# Patient Record
Sex: Male | Born: 1980
Health system: Southern US, Community
[De-identification: ages and names within clinical notes are randomized; demographics above are authoritative.]

## PROBLEM LIST (undated history)

## (undated) DIAGNOSIS — Z8679 Personal history of other diseases of the circulatory system: Secondary | ICD-10-CM

## (undated) DIAGNOSIS — F410 Panic disorder [episodic paroxysmal anxiety] without agoraphobia: Secondary | ICD-10-CM

## (undated) DIAGNOSIS — I10 Essential (primary) hypertension: Secondary | ICD-10-CM

## (undated) DIAGNOSIS — M25562 Pain in left knee: Secondary | ICD-10-CM

## (undated) DIAGNOSIS — G43909 Migraine, unspecified, not intractable, without status migrainosus: Secondary | ICD-10-CM

## (undated) DIAGNOSIS — Z8619 Personal history of other infectious and parasitic diseases: Secondary | ICD-10-CM

## (undated) DIAGNOSIS — M549 Dorsalgia, unspecified: Secondary | ICD-10-CM

## (undated) DIAGNOSIS — J302 Other seasonal allergic rhinitis: Secondary | ICD-10-CM

## (undated) DIAGNOSIS — T7840XA Allergy, unspecified, initial encounter: Secondary | ICD-10-CM

## (undated) DIAGNOSIS — E785 Hyperlipidemia, unspecified: Secondary | ICD-10-CM

## (undated) DIAGNOSIS — F1290 Cannabis use, unspecified, uncomplicated: Secondary | ICD-10-CM

## (undated) HISTORY — DX: Migraine, unspecified, not intractable, without status migrainosus: G43.909

## (undated) HISTORY — DX: Dorsalgia, unspecified: M54.9

## (undated) HISTORY — DX: Personal history of other diseases of the circulatory system: Z86.79

## (undated) HISTORY — DX: Panic disorder (episodic paroxysmal anxiety): F41.0

## (undated) HISTORY — DX: Personal history of other infectious and parasitic diseases: Z86.19

## (undated) HISTORY — DX: Cannabis use, unspecified, uncomplicated: F12.90

## (undated) HISTORY — DX: Essential (primary) hypertension: I10

## (undated) HISTORY — DX: Pain in left knee: M25.562

## (undated) HISTORY — DX: Hyperlipidemia, unspecified: E78.5

## (undated) HISTORY — DX: Other seasonal allergic rhinitis: J30.2

## (undated) HISTORY — DX: Allergy, unspecified, initial encounter: T78.40XA

---
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
## 2010-08-30 ENCOUNTER — Emergency Department (HOSPITAL_COMMUNITY): Payer: BC Managed Care – PPO

## 2010-08-30 ENCOUNTER — Inpatient Hospital Stay (HOSPITAL_COMMUNITY)
Admission: EM | Admit: 2010-08-30 | Discharge: 2010-09-01 | DRG: 144 | Disposition: A | Discharge status: Home / Self Care | Payer: BC Managed Care – PPO | Attending: Internal Medicine | Admitting: Internal Medicine

## 2010-08-30 ENCOUNTER — Encounter (HOSPITAL_COMMUNITY): Payer: Self-pay | Admitting: Radiology

## 2010-08-30 DIAGNOSIS — Z7982 Long term (current) use of aspirin: Secondary | ICD-10-CM

## 2010-08-30 DIAGNOSIS — J9819 Other pulmonary collapse: Secondary | ICD-10-CM | POA: Diagnosis present

## 2010-08-30 DIAGNOSIS — F410 Panic disorder [episodic paroxysmal anxiety] without agoraphobia: Secondary | ICD-10-CM | POA: Diagnosis present

## 2010-08-30 DIAGNOSIS — I309 Acute pericarditis, unspecified: Principal | ICD-10-CM | POA: Diagnosis present

## 2010-08-30 LAB — CBC
HCT: 44.9 % (ref 39.0–52.0)
Hemoglobin: 16.6 g/dL (ref 13.0–17.0)
MCHC: 37 g/dL — ABNORMAL HIGH (ref 30.0–36.0)
MCV: 85.7 fL (ref 78.0–100.0)
RDW: 12.8 % (ref 11.5–15.5)
WBC: 15.1 10*3/uL — ABNORMAL HIGH (ref 4.0–10.5)

## 2010-08-30 LAB — CK TOTAL AND CKMB (NOT AT ARMC)
CK, MB: 1.4 ng/mL (ref 0.3–4.0)
Relative Index: INVALID (ref 0.0–2.5)
Relative Index: INVALID (ref 0.0–2.5)
Total CK: 89 U/L (ref 7–232)

## 2010-08-30 LAB — TROPONIN I
Troponin I: <0.3 ng/mL (ref <0.30)
Troponin I: <0.3 ng/mL (ref <0.30)

## 2010-08-30 LAB — DIFFERENTIAL
Eosinophils Relative: 0 % (ref 0–5)
Lymphocytes Relative: 8 % — ABNORMAL LOW (ref 12–46)
Lymphs Abs: 1.3 10*3/uL (ref 0.7–4.0)
Monocytes Absolute: 1.2 10*3/uL — ABNORMAL HIGH (ref 0.1–1.0)
Neutro Abs: 12.6 10*3/uL — ABNORMAL HIGH (ref 1.7–7.7)

## 2010-08-30 LAB — COMPREHENSIVE METABOLIC PANEL
ALT: 29 U/L (ref 0–53)
AST: 19 U/L (ref 0–37)
Albumin: 4.4 g/dL (ref 3.5–5.2)
CO2: 27 mEq/L (ref 19–32)
Calcium: 10.1 mg/dL (ref 8.4–10.5)
GFR calc non Af Amer: >60 mL/min (ref >60)
Sodium: 139 mEq/L (ref 135–145)

## 2010-08-30 MED ORDER — IOHEXOL 300 MG/ML  SOLN
100.0000 mL | Freq: Once | INTRAMUSCULAR | Status: AC | PRN
  Administered 2010-08-30: 100 mL via INTRAVENOUS

## 2010-08-31 LAB — CARDIAC PANEL(CRET KIN+CKTOT+MB+TROPI)
CK, MB: 1.6 ng/mL (ref 0.3–4.0)
CK, MB: 1.5 ng/mL (ref 0.3–4.0)
Relative Index: INVALID (ref 0.0–2.5)
Total CK: 64 U/L (ref 7–232)
Total CK: 61 U/L (ref 7–232)
Troponin I: <0.3 ng/mL (ref <0.30)
Troponin I: <0.3 ng/mL (ref <0.30)

## 2010-08-31 LAB — CBC
HCT: 44.5 % (ref 39.0–52.0)
Hemoglobin: 16 g/dL (ref 13.0–17.0)
RBC: 5.13 MIL/uL (ref 4.22–5.81)
WBC: 8.5 10*3/uL (ref 4.0–10.5)

## 2010-08-31 LAB — BASIC METABOLIC PANEL
Calcium: 9.8 mg/dL (ref 8.4–10.5)
GFR calc Af Amer: >60 mL/min (ref >60)
GFR calc non Af Amer: >60 mL/min (ref >60)
Glucose, Bld: 90 mg/dL (ref 70–99)
Potassium: 3.6 mEq/L (ref 3.5–5.1)
Sodium: 140 mEq/L (ref 135–145)

## 2010-08-31 LAB — PHOSPHORUS: Phosphorus: 1.2 mg/dL — ABNORMAL LOW (ref 2.3–4.6)

## 2010-08-31 LAB — RAPID URINE DRUG SCREEN, HOSP PERFORMED
Barbiturates: NOT DETECTED
Cocaine: NOT DETECTED
Tetrahydrocannabinol: POSITIVE — AB

## 2010-08-31 LAB — SEDIMENTATION RATE: Sed Rate: 2 mm/hr (ref 0–16)

## 2010-09-01 ENCOUNTER — Inpatient Hospital Stay (HOSPITAL_COMMUNITY): Payer: BC Managed Care – PPO

## 2010-09-01 ENCOUNTER — Encounter (HOSPITAL_COMMUNITY): Payer: Self-pay | Admitting: Radiology

## 2010-09-01 LAB — BASIC METABOLIC PANEL
CO2: 26 mEq/L (ref 19–32)
Calcium: 9.6 mg/dL (ref 8.4–10.5)
Creatinine, Ser: 0.65 mg/dL (ref 0.50–1.35)
GFR calc non Af Amer: >60 mL/min (ref >60)
Glucose, Bld: 108 mg/dL — ABNORMAL HIGH (ref 70–99)
Sodium: 141 mEq/L (ref 135–145)

## 2010-09-01 LAB — CBC
MCH: 31.2 pg (ref 26.0–34.0)
MCHC: 36 g/dL (ref 30.0–36.0)
MCV: 86.5 fL (ref 78.0–100.0)
Platelets: 213 10*3/uL (ref 150–400)
RBC: 5.04 MIL/uL (ref 4.22–5.81)

## 2010-09-01 MED ORDER — IOHEXOL 300 MG/ML  SOLN
100.0000 mL | Freq: Once | INTRAMUSCULAR | Status: AC | PRN
  Administered 2010-09-01: 100 mL via INTRAVENOUS

## 2010-09-05 NOTE — Discharge Summary (Signed)
  NAMELYNDON, CHAPEL            ACCOUNT NO.:  [id number]  MEDICAL RECORD NO.:  [id number]  LOCATION:  2040                         FACILITY:  [facility]  PHYSICIAN:  Conley Canal, MD      DATE OF BIRTH:  [birth year]  DATE OF ADMISSION:  08/30/2010 DATE OF DISCHARGE:  09/01/2010                        DISCHARGE SUMMARY - REFERRING   PRIMARY CARE PHYSICIAN:  The patient has no primary care provider.  DISCHARGE DIAGNOSES: 1. Chest pain most likely secondary to acute early pericarditis versus     left-sided atelectasis. 2. Panic anxiety disorder.  DISCHARGE MEDICATIONS: 1. Alprazolam 0.5 mg twice daily as needed, 15 tablets given. 2. Motrin 400 mg 3 times daily. 3. Excedrin Headache 2 tablets daily as needed. 4. Tramadol 50 mg q.6h. as needed.  No new prescription given.  PROCEDURES PERFORMED: 1. CT chest with contrast on August 30, 2010, showed no evidence of PE.     Some mild dependent bilateral lower lobe atelectasis. 2. CT abdomen and pelvis on September 01, 2010, showed no CT findings to     account for the patient's abdominal symptoms. 3. A 2D echocardiogram on August 31, 2010, showed EF 55% to 60% as well     as a trivial pericardial effusion.  No tamponade physiology.  HOSPITAL COURSE:  Mr. Sem was admitted on August 30, 2010, with complaints of acute onset chest pain associated with an episode of vomiting.  There was concern for pericarditis at admission and echocardiogram showed trivial pericardial effusion.  Workup included a sed rate which was normal.  TSH was also normal.  Urine drug screen positive for marijuana.  Otherwise, cardiac enzymes were negative x3. CT chest suggested some bibasilar atelectasis, otherwise no PE or pneumonia.  His white count at admission was 15,000.  Given this concern, CT abdomen and pelvis was obtained as the patient had had an episode of vomiting which could be accounted for and the CT abdomen and pelvis was unremarkable.  It is likely  that he had early onset pericarditis which seems to have improved with some atelectasis.  He will need followup with his primary care physician if symptoms recur.  Labs include amylase, lipase electrolytes, and CBC which were all normal at discharge.  The time spent for this discharge preparation is less than 30 minutes.     Conley Canal, MD     SR/MEDQ  D:  09/01/2010  T:  09/01/2010  Job:  [id number]  Electronically Signed by Conley Canal  on 09/05/2010 05:46:26 PM

## 2010-09-13 ENCOUNTER — Other Ambulatory Visit: Payer: Self-pay | Admitting: *Deleted

## 2010-09-13 MED ORDER — ALPRAZOLAM 0.5 MG PO TABS: 0.5000 mg | ORAL_TABLET | Freq: Two times a day (BID) | ORAL | Status: DC | PRN

## 2010-09-13 NOTE — Telephone Encounter (Signed)
Rx called in as directed and patient notified.  

## 2010-09-13 NOTE — Telephone Encounter (Signed)
May refill #10, no refills.  plz phone in.

## 2010-09-13 NOTE — Telephone Encounter (Signed)
Patient called and requested a refill on his xanax. He is scheduled to come in and establish with you on 09-17-10. He was discharged from [facility] about 2 weeks ago with enough pills to last with the impression he would be able to establish with you last week. However, no appointments were available/convenient until this week. He said he was normally taking 1 QD but the Rx was for 1 BID. I told him if it was refilled it would only be enough for this week, but that I would have to call him to let him know for sure. He verbalized understanding.
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
## 2010-09-17 ENCOUNTER — Ambulatory Visit (INDEPENDENT_AMBULATORY_CARE_PROVIDER_SITE_OTHER): Payer: BC Managed Care – PPO | Admitting: Family Medicine

## 2010-09-17 ENCOUNTER — Encounter: Payer: Self-pay | Admitting: Family Medicine

## 2010-09-17 DIAGNOSIS — J302 Other seasonal allergic rhinitis: Secondary | ICD-10-CM | POA: Insufficient documentation

## 2010-09-17 DIAGNOSIS — F410 Panic disorder [episodic paroxysmal anxiety] without agoraphobia: Secondary | ICD-10-CM | POA: Insufficient documentation

## 2010-09-17 DIAGNOSIS — J309 Allergic rhinitis, unspecified: Secondary | ICD-10-CM

## 2010-09-17 DIAGNOSIS — G43909 Migraine, unspecified, not intractable, without status migrainosus: Secondary | ICD-10-CM

## 2010-09-17 DIAGNOSIS — Z0000 Encounter for general adult medical examination without abnormal findings: Secondary | ICD-10-CM

## 2010-09-17 DIAGNOSIS — F1290 Cannabis use, unspecified, uncomplicated: Secondary | ICD-10-CM

## 2010-09-17 DIAGNOSIS — M25562 Pain in left knee: Secondary | ICD-10-CM | POA: Insufficient documentation

## 2010-09-17 DIAGNOSIS — M549 Dorsalgia, unspecified: Secondary | ICD-10-CM | POA: Insufficient documentation

## 2010-09-17 DIAGNOSIS — M25569 Pain in unspecified knee: Secondary | ICD-10-CM

## 2010-09-17 DIAGNOSIS — Z8249 Family history of ischemic heart disease and other diseases of the circulatory system: Secondary | ICD-10-CM

## 2010-09-17 DIAGNOSIS — F1210 Cannabis abuse, uncomplicated: Secondary | ICD-10-CM

## 2010-09-17 MED ORDER — ALPRAZOLAM 0.5 MG PO TABS: 0.5000 mg | ORAL_TABLET | Freq: Every day | ORAL | Status: DC | PRN

## 2010-09-17 MED ORDER — CYCLOBENZAPRINE HCL 10 MG PO TABS: 10.0000 mg | ORAL_TABLET | Freq: Two times a day (BID) | ORAL | Status: DC | PRN

## 2010-09-17 MED ORDER — NAPROXEN 500 MG PO TABS: ORAL_TABLET | ORAL | Status: DC

## 2010-09-17 NOTE — Patient Instructions (Addendum)
Return fasting at your convenience for blood work to check cholesterol levels. Low cholesterol diet provided. Stop tramadol for now.   Start naprosyn twice daily with food as well as flexeril for back pain as needed (may make you sleepy). Bring name of knee doctor or records to see what has been done in past. Good to meet you today, call us with questions. knee exercises provided today.
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
## 2010-09-18 DIAGNOSIS — Z0001 Encounter for general adult medical examination with abnormal findings: Secondary | ICD-10-CM | POA: Insufficient documentation

## 2010-09-18 DIAGNOSIS — Z0000 Encounter for general adult medical examination without abnormal findings: Secondary | ICD-10-CM | POA: Insufficient documentation

## 2010-09-18 DIAGNOSIS — F1290 Cannabis use, unspecified, uncomplicated: Secondary | ICD-10-CM | POA: Insufficient documentation

## 2010-09-18 DIAGNOSIS — Z8249 Family history of ischemic heart disease and other diseases of the circulatory system: Secondary | ICD-10-CM | POA: Insufficient documentation

## 2010-09-18 HISTORY — DX: Cannabis use, unspecified, uncomplicated: F12.90

## 2010-09-18 NOTE — Assessment & Plan Note (Signed)
Recently improved.

## 2010-09-18 NOTE — Assessment & Plan Note (Signed)
Anticipate component of PFPS vs arthritis. Advised stop tramadol for now. Trial of NSAIDs, knee exercises from [facility] pt advisor for PFPS provided Await records from ortho, asked pt to bring.

## 2010-09-18 NOTE — Assessment & Plan Note (Signed)
Discussed anxiety/panic attacks Xanax for temporary use.   Provided with refill #20, rtc 1 mo.  If need for med continued, discussed better long term management strategy (SSRI, etc).  Pt agrees.

## 2010-09-18 NOTE — Assessment & Plan Note (Signed)
Reviewed recent blood work (from hospital). Return fasting for FLP to risk stratify.  Early fmhx CAD.

## 2010-09-21 ENCOUNTER — Telehealth: Payer: Self-pay | Admitting: *Deleted

## 2010-09-21 NOTE — Telephone Encounter (Signed)
Pt called to let you know that he had seen Dr. Lenard Forth for knee pain and the medicine that he gave him was relafen.  This made him sleepy.  The flexeril that you prescribed also makes him sleepy.  He feels sleepy and relaxed with it but it doesn't help the pain.  States he will continue to do what you have told him to do.

## 2010-09-22 NOTE — Telephone Encounter (Signed)
Please add name of Dr Lenard Forth (or possibly PA) to ROI and check if office is with [facility] in [place]. Will need him to return to sign ROI so we can send it. Is naprosyn scheduled helping?

## 2010-09-22 NOTE — Telephone Encounter (Signed)
Spoke with patient. He said he has no chest pain anymore. His back is hurting in the mornings when he wakes up to the point it's hard to get out of bed. He said it doesn't feel muscular as much as spinal. He said the naprosyn helps some. He likes the fact that he doesn't have to take as many pills throughout the day. He said he decreased his xanax down to 1/2 of a pill. He said he doesn't even need it everyday. He is going to stop by today and sign the ROI form.

## 2010-09-22 NOTE — Telephone Encounter (Signed)
Message left for patient to stop by and sign ROI form and to call and advise if naprosyn was helping.

## 2010-09-22 NOTE — Telephone Encounter (Signed)
Patient notified and ROI form signed.

## 2010-09-22 NOTE — Telephone Encounter (Signed)
Noted.  Give naprosyn more time, but if back pain getting worse may want him to come in prior to next scheduled appt.

## 2010-09-28 ENCOUNTER — Telehealth: Payer: Self-pay | Admitting: *Deleted

## 2010-09-28 NOTE — Telephone Encounter (Signed)
Ok to stop flexeril.  May take on PRN basis for back spasm/strain.  If so sedating, could try 1/2 pill when takes on PRN basis.

## 2010-09-28 NOTE — Telephone Encounter (Signed)
Message left advising patient. Instructed to call with any questions.  

## 2010-09-28 NOTE — Telephone Encounter (Signed)
Patient wants to know if he can stop taking Flexeril?  He stated that he has been taking it for about two weeks now and it's really not helping.  He stated that it makes him very sleepy and he sleeps for at least 12 hours after he has taken Flexeril and he stated that he cannot function and continue taking this medication.  Please advise.

## 2010-10-18 ENCOUNTER — Ambulatory Visit (INDEPENDENT_AMBULATORY_CARE_PROVIDER_SITE_OTHER): Payer: BC Managed Care – PPO | Admitting: Family Medicine

## 2010-10-18 ENCOUNTER — Encounter: Payer: Self-pay | Admitting: Family Medicine

## 2010-10-18 DIAGNOSIS — M25562 Pain in left knee: Secondary | ICD-10-CM

## 2010-10-18 DIAGNOSIS — Z0000 Encounter for general adult medical examination without abnormal findings: Secondary | ICD-10-CM

## 2010-10-18 DIAGNOSIS — Z13220 Encounter for screening for lipoid disorders: Secondary | ICD-10-CM

## 2010-10-18 DIAGNOSIS — M25569 Pain in unspecified knee: Secondary | ICD-10-CM

## 2010-10-18 DIAGNOSIS — Z23 Encounter for immunization: Secondary | ICD-10-CM

## 2010-10-18 DIAGNOSIS — Z8249 Family history of ischemic heart disease and other diseases of the circulatory system: Secondary | ICD-10-CM

## 2010-10-18 DIAGNOSIS — F410 Panic disorder [episodic paroxysmal anxiety] without agoraphobia: Secondary | ICD-10-CM

## 2010-10-18 LAB — LDL CHOLESTEROL, DIRECT: Direct LDL: 116.7 mg/dL

## 2010-10-18 LAB — LIPID PANEL
Cholesterol: 180 mg/dL (ref 0–200)
Total CHOL/HDL Ratio: 4

## 2010-10-18 NOTE — Progress Notes (Signed)
  Subjective:    Patient ID: Derrick Santiago, male    DOB: September 19, 1980, 30 y.o.   MRN: [id number]  HPI CC: 1 mo f/u  Panic attacks - resolved.  Still has xanax, hasn't needed to use in several weeks.  Knee and back pain - weaned off tramadol, none for last 3 wks.  Knee pain improved.  Overall feeling better off narcotics.  Not limping as much as previously.  Back pain continues, taking 1/2 flexeril as needed, using at night time only prn basis because causes significant sedation.Eilleen Kempf that when cracks back, pain improves.  Has signed release form for ortho.  Saw Dr. Lenard Forth at [facility].  Awaiting records.  Did have episode of dark red blood in stool, none when wiping.  This happened after 2 wks on naprosyn, has stopped NSAID.  No abd pain or nausea/vomiting with this, no fevers/chills, no dyspepsia.  Wt Readings from Last 3 Encounters:  10/18/10 224 lb 0.6 oz (101.624 kg)  09/17/10 221 lb 8 oz (100.472 kg)   Would like tetanus and flu shots today.  Due for blood work today.  Review of Systems Per HPI    Objective:   Physical Exam  Nursing note and vitals reviewed. Constitutional: He appears well-developed and well-nourished. No distress.  HENT:  Head: Normocephalic and atraumatic.  Mouth/Throat: Oropharynx is clear and moist. No oropharyngeal exudate.  Neck: Normal range of motion. Neck supple.  Cardiovascular: Normal rate, regular rhythm, normal heart sounds and intact distal pulses.   No murmur heard. Pulmonary/Chest: Effort normal and breath sounds normal. No respiratory distress. He has no wheezes. He has no rales.  Musculoskeletal: He exhibits no edema.  Skin: Skin is warm and dry. No rash noted.          Assessment & Plan:

## 2010-10-18 NOTE — Patient Instructions (Signed)
Flu and tetanus/pertussis (tdap) today. Blood work today. We will await records from ortho. Please return if things not improving as expected.

## 2010-10-18 NOTE — Assessment & Plan Note (Signed)
Improved

## 2010-10-18 NOTE — Assessment & Plan Note (Signed)
Improved.  Await records.

## 2010-12-07 ENCOUNTER — Other Ambulatory Visit: Payer: Self-pay | Admitting: Family Medicine

## 2011-05-10 ENCOUNTER — Telehealth: Payer: Self-pay

## 2011-05-10 NOTE — Telephone Encounter (Signed)
Pt called and did not get lab results from 10/2010. I gave results and pt said had lost blue card for phone tree. Pt said since 02/01/11 pt has kept migraine journal. Pt has had 9 migraines since Jan 2013 and takes OTC excedrin migraine. Pt said duration of migraine is from 4 hours to 10 + hours.Pt also has neck and back pain. Pts mother and brother have degenerative bone disease. Pt thinks he should be tested. Pt last seen by Dr Reece Agar 10/18/10. Pt said if Dr Reece Agar wanted to see him he would make appt. But wanted to ask Dr Reece Agar before making appt. Pt uses [facility] and can be reached at [phone].

## 2011-05-10 NOTE — Telephone Encounter (Signed)
plz have pt make appt to discuss.

## 2011-05-11 NOTE — Telephone Encounter (Signed)
Spoke with patient and scheduled appt

## 2011-05-18 ENCOUNTER — Ambulatory Visit (INDEPENDENT_AMBULATORY_CARE_PROVIDER_SITE_OTHER): Payer: BC Managed Care – PPO | Admitting: Family Medicine

## 2011-05-18 ENCOUNTER — Encounter: Payer: Self-pay | Admitting: Family Medicine

## 2011-05-18 VITALS — BP 144/82 | HR 84 | Temp 98.5°F | Wt 241.8 lb

## 2011-05-18 DIAGNOSIS — M549 Dorsalgia, unspecified: Secondary | ICD-10-CM

## 2011-05-18 DIAGNOSIS — G43909 Migraine, unspecified, not intractable, without status migrainosus: Secondary | ICD-10-CM

## 2011-05-18 MED ORDER — SUMATRIPTAN SUCCINATE 50 MG PO TABS: 50.0000 mg | ORAL_TABLET | Freq: Every day | ORAL | Status: DC | PRN

## 2011-05-18 MED ORDER — CYCLOBENZAPRINE HCL 5 MG PO TABS: 5.0000 mg | ORAL_TABLET | Freq: Two times a day (BID) | ORAL | Status: DC | PRN

## 2011-05-18 NOTE — Assessment & Plan Note (Signed)
Longstanding. Provided with LBP stretching exercises from [facility] pt advisor. If not improved, consider further eval (imaging).

## 2011-05-18 NOTE — Progress Notes (Signed)
  Subjective:    Patient ID: Derrick Santiago, male    DOB: 28-Jan-1981, 31 y.o.   MRN: [id number]  HPI CC: discuss migraines  Has been keeping headache diary, brings this today.  Usually controlled with 2 excedrin migraine, has tried boss' imitrex.  Usually hot shower also relieves some.  Has had 9 migraines since January.  Had 4 migraines between September and January.  Usually excedrin improves pain but doesn't completely resolve pain (when helps, migraine only lasts 4 hours).  If he doesn't catch migraine early enough, HA may last 8-10 hours.  Described as throbbing pain bilateral vertex of head at parietal region.  Feels them coming on with neck stiffness then radiation from neck up posterior head.  + photo/phonophobia.  No nausea.  1d prior to migraine gets loose stool as well.  Occasional aura - sees different colors in vision.  Sometimes debilitating headache but if caught early enough able to control enough to continue at work.  No fevers/chills.  No vision changes.  No dizziness. No double vision.  LBP and neck pain - Alternates tylenol, ibuprofen, and aleve for longstanding back and neck pain.  No recent imaging.  Neck pain - stays at base of neck.  Feels stiffness intermittently.  No numbness or paresthesias.  No shooting pain down arms.  No arm weakness.  Lower back pain - occasional shooting pain down left lateral thigh to knee.  Denies numbness or weakness in legs.  Used to do roofing work.  Back/neck pain did improve with less physical work.  But notes more on weekends when working around house.  Family history of migraines and DDD.  Past Medical History  Diagnosis Date  . Seasonal allergies   . History of high blood pressure     readings- no dx of HTN  . Migraines   . Panic attack     worse last 6 mo  . History of chicken pox   . Back pain   . Left knee pain    Past Surgical History  Procedure Date  . Appendectomy 1993    Review of Systems Per HPI    Objective:     Physical Exam  Nursing note and vitals reviewed. Constitutional: He is oriented to person, place, and time. He appears well-developed and well-nourished. No distress.  HENT:  Head: Normocephalic and atraumatic.  Mouth/Throat: Oropharynx is clear and moist. No oropharyngeal exudate.  Eyes: Conjunctivae and EOM are normal. Pupils are equal, round, and reactive to light. No scleral icterus.  Neck: Normal range of motion. Neck supple.  Musculoskeletal:       FROM at neck. Splenius mm tightness bilaterally, mild  No midline lumbar spine tenderness. No paraspinous mm tenderness but significant tightness lumbar mm bilaterally. Neg SLR bilaterally, no pain with int/ext rotation at hip. Mild tenderness at GTB bilaterally, no pain at sciatic notch or SIJ bilaterally  Neurological: He is alert and oriented to person, place, and time. He has normal strength. No cranial nerve deficit or sensory deficit. He displays a negative Romberg sign. Coordination normal.       CN 2-12 intact FTN intact. No pronator drift  Skin: Skin is warm and dry. No rash noted.  Psychiatric: He has a normal mood and affect.       Assessment & Plan:

## 2011-05-18 NOTE — Assessment & Plan Note (Addendum)
Discussed MOH. Discussed treatment of migraines and importance of catching early. Treat with abortive regimen of imitrex and flexeril.  Discussed use of these meds. Continue to monitor for migraine triggers. Sent in 5mg  flexeril as when cuts 10mg  causes increased sedation. A total of 25 minutes were spent face-to-face with the patient during this encounter and over half of that time was spent on counseling and coordination of care

## 2011-05-18 NOTE — Patient Instructions (Signed)
For lower back - try stretching exercises provided. For migraines - trial of flexeril and imitrex at onset of headache. Let me know how this works. Good to see you today, call us with quesitons.

## 2012-01-13 ENCOUNTER — Encounter: Payer: Self-pay | Admitting: Family Medicine

## 2012-01-13 ENCOUNTER — Ambulatory Visit (INDEPENDENT_AMBULATORY_CARE_PROVIDER_SITE_OTHER): Payer: BC Managed Care – PPO | Admitting: Family Medicine

## 2012-01-13 VITALS — BP 138/86 | HR 96 | Temp 98.4°F | Wt 244.8 lb

## 2012-01-13 DIAGNOSIS — L509 Urticaria, unspecified: Secondary | ICD-10-CM

## 2012-01-13 DIAGNOSIS — Z23 Encounter for immunization: Secondary | ICD-10-CM

## 2012-01-13 NOTE — Assessment & Plan Note (Signed)
Only skin involvement currently. Unclear trigger. Discussed generalized urticaria, ?stress induced.. Treat with claritin qd - bid, benadryl prn. To update [place] if sxs persist or not improving for referral to allergist for further evaluation.

## 2012-01-13 NOTE — Patient Instructions (Addendum)
You do have hives. Treat with claritin daily (may go up to 10mg  twice daily if needed) and benadryl for breakthrough itching.  Could also add zantac or pepcid. If not improving with this, let me know for allergist referral Flu shot today.  Hives Hives are itchy, red, swollen areas of the skin. They can vary in size and location on your body. Hives can come and go for hours or several days (acute hives) or for several weeks (chronic hives). Hives do not spread from person to person (noncontagious). They may get worse with scratching, exercise, and emotional stress. CAUSES   Allergic reaction to food, additives, or drugs.  Infections, including the common cold.  Illness, such as vasculitis, lupus, or thyroid disease.  Exposure to sunlight, heat, or cold.  Exercise.  Stress.  Contact with chemicals. SYMPTOMS   Red or white swollen patches on the skin. The patches may change size, shape, and location quickly and repeatedly.  Itching.  Swelling of the hands, feet, and face. This may occur if hives develop deeper in the skin. DIAGNOSIS  Your caregiver can usually tell what is wrong by performing a physical exam. Skin or blood tests may also be done to determine the cause of your hives. In some cases, the cause cannot be determined. TREATMENT  Mild cases usually get better with medicines such as antihistamines. Severe cases may require an emergency epinephrine injection. If the cause of your hives is known, treatment includes avoiding that trigger.  HOME CARE INSTRUCTIONS   Avoid causes that trigger your hives.  Take antihistamines as directed by your caregiver to reduce the severity of your hives. Non-sedating or low-sedating antihistamines are usually recommended. Do not drive while taking an antihistamine.  Take any other medicines prescribed for itching as directed by your caregiver.  Wear loose-fitting clothing.  Keep all follow-up appointments as directed by your  caregiver. SEEK MEDICAL CARE IF:   You have persistent or severe itching that is not relieved with medicine.  You have painful or swollen joints. SEEK IMMEDIATE MEDICAL CARE IF:   You have a fever.  Your tongue or lips are swollen.  You have trouble breathing or swallowing.  You feel tightness in the throat or chest.  You have abdominal pain. These problems may be the first sign of a life-threatening allergic reaction. Call your local emergency services (911 in [place] MAKE SURE YOU:   Understand these instructions.  Will watch your condition.  Will get help right away if you are not doing well or get worse. Document Released: 01/17/2005 Document Revised: 07/19/2011 Document Reviewed: 04/12/2011 [employer] Patient Information 2013 [place], [place].

## 2012-01-13 NOTE — Addendum Note (Signed)
Addended by: Annamarie Major on: 01/13/2012 03:29 PM   Modules accepted: Orders

## 2012-01-13 NOTE — Progress Notes (Signed)
  Subjective:    Patient ID: Derrick Santiago, male    DOB: 09-Mar-1980, 30 y.o.   MRN: [id number]  HPI CC: hives  1 wk prior to thanksgiving traveled on airplane - when left plane, noticed rash on neck and upper chest.  This improved.  Then over last few weeks, had subway meatball sandwich on flatbread twice - had "hive on lip" with some swelling and hives over face.  Yesterday bad hives over neck and face, as well as chest, hip, arm.  Tends to have more at work.Took claritin last night at 8pm.  Hasn't noticed specific trigger.  Brings picture of hives on neck, underarm, arms, chest.  Was taking claritin daily, stopped taking November. Recently promoted to management Warehouse manager.  Increased stress here. Checks bp at store and ranges 120-130/70s.  did not feel more warm than normal.  No new lotions, detergents, soaps, shampoos.  No diet changes.  No new foods.    Would like flu shot today.  Has been taking 1 aleve regularly since spring 2013 - for knee and back pain.  No recent imitrex use.  Has tried benadryl as well  Helps itch but doesn't help hives resolve.  claritin does help hives resolve.  Wt Readings from Last 3 Encounters:  01/13/12 244 lb 12 oz (111.018 kg)  05/18/11 241 lb 12 oz (109.657 kg)  10/18/10 224 lb 0.6 oz (101.624 kg)    Past Medical History  Diagnosis Date  . Seasonal allergies   . History of high blood pressure     readings- no dx of HTN  . Migraines   . Panic attack     worse mid 2012  . History of chicken pox   . Back pain   . Left knee pain     Review of Systems Per HPI    Objective:   Physical Exam NAD, CM, obese, WDWN No dermatographia.  No skin rashes.    Assessment & Plan:

## 2012-02-29 ENCOUNTER — Other Ambulatory Visit: Payer: Self-pay | Admitting: Family Medicine

## 2012-02-29 DIAGNOSIS — E781 Pure hyperglyceridemia: Secondary | ICD-10-CM

## 2012-02-29 DIAGNOSIS — Z0000 Encounter for general adult medical examination without abnormal findings: Secondary | ICD-10-CM

## 2012-03-08 ENCOUNTER — Other Ambulatory Visit (INDEPENDENT_AMBULATORY_CARE_PROVIDER_SITE_OTHER): Payer: BC Managed Care – PPO

## 2012-03-08 DIAGNOSIS — E781 Pure hyperglyceridemia: Secondary | ICD-10-CM

## 2012-03-08 LAB — COMPREHENSIVE METABOLIC PANEL
BUN: 15 mg/dL (ref 6–23)
CO2: 27 mEq/L (ref 19–32)
Calcium: 9.3 mg/dL (ref 8.4–10.5)
Chloride: 104 mEq/L (ref 96–112)
Creatinine, Ser: 0.9 mg/dL (ref 0.4–1.5)
GFR: 97.63 mL/min (ref >60.00)
Glucose, Bld: 101 mg/dL — ABNORMAL HIGH (ref 70–99)
Total Bilirubin: 0.8 mg/dL (ref 0.3–1.2)

## 2012-03-08 LAB — TSH: TSH: 1 u[IU]/mL (ref 0.35–5.50)

## 2012-03-08 LAB — LIPID PANEL
Cholesterol: 189 mg/dL (ref 0–200)
HDL: 38.4 mg/dL — ABNORMAL LOW (ref >39.00)
Triglycerides: 123 mg/dL (ref 0.0–149.0)

## 2012-03-14 ENCOUNTER — Encounter: Payer: BC Managed Care – PPO | Admitting: Family Medicine

## 2012-03-15 ENCOUNTER — Encounter: Payer: BC Managed Care – PPO | Admitting: Family Medicine

## 2012-03-23 ENCOUNTER — Ambulatory Visit (INDEPENDENT_AMBULATORY_CARE_PROVIDER_SITE_OTHER): Payer: BC Managed Care – PPO | Admitting: Family Medicine

## 2012-03-23 ENCOUNTER — Encounter: Payer: Self-pay | Admitting: Family Medicine

## 2012-03-23 VITALS — BP 142/82 | HR 96 | Temp 98.2°F | Ht 74.0 in | Wt 246.0 lb

## 2012-03-23 DIAGNOSIS — G43909 Migraine, unspecified, not intractable, without status migrainosus: Secondary | ICD-10-CM

## 2012-03-23 DIAGNOSIS — R7401 Elevation of levels of liver transaminase levels: Secondary | ICD-10-CM

## 2012-03-23 DIAGNOSIS — Z0000 Encounter for general adult medical examination without abnormal findings: Secondary | ICD-10-CM

## 2012-03-23 DIAGNOSIS — L509 Urticaria, unspecified: Secondary | ICD-10-CM

## 2012-03-23 DIAGNOSIS — R0789 Other chest pain: Secondary | ICD-10-CM

## 2012-03-23 MED ORDER — HYDROXYZINE HCL 25 MG PO TABS: 25.0000 mg | ORAL_TABLET | Freq: Three times a day (TID) | ORAL | Status: DC | PRN

## 2012-03-23 NOTE — Assessment & Plan Note (Signed)
Preventative protocols reviewed and updated unless pt declined. Discussed healthy diet and lifestyle.  

## 2012-03-23 NOTE — Assessment & Plan Note (Signed)
In h/o panic attacks but with fmhx early CAD. Declines stress test referral. Will do trial of hydroxyzine for likely anxiety attacks - if not helping, low threshold to refer to cardiology. Aware to notify me immediately or seek urgent care if worsening chest pain.

## 2012-03-23 NOTE — Patient Instructions (Signed)
For chest pain - try hydroxyzine as needed for anxiety.  If not helping, call me for referral to heart doctor. For neck - try regular flexeril twice to three times daily as well as ice/heat to neck.  We will recheck at next visit. Return in 3months for follow up Return prior to appointment for blood work to recheck liver function Good to see you today ,call us with questions.

## 2012-03-23 NOTE — Assessment & Plan Note (Signed)
Declines toradol shot today. Will go home, take imitrex, hot shower and nap.

## 2012-03-23 NOTE — Assessment & Plan Note (Signed)
Thought to be due to soy bean oil.

## 2012-03-23 NOTE — Progress Notes (Signed)
Subjective:    Patient ID: Derrick Santiago, male    DOB: [birth year], 32 y.o.   MRN: [id number]  HPI CC: CPE  Currently with migraine.  Started last night, improved with imitrex.  Over the day today has migraine has returned.  Describes bilateral frontal throbbing pain.  No current photo/phonophobia, nausea.  Felt nauseated this morning.  + aura.  Urticaria - stopped soy bean oil and hives went away.  Chronic neck pain - longstanding.  Intermittent L chest discomfort weekly described as tightness with shortness of breath.  Lasts 10 min to 2 hours.  Occurs more with stress.  Not exertional - walks daily 2-5 mi at work and home, never any chest discomfort with this. Hospitalization 09/2010 with chest discomfort, with CT chest /abd /pelvis and 2D echo showing trivial pericardial effusion with EF 55-60%. Mother with h/o CAD/MI at age 28s.  She was a smoker and didn't eat healthy.  Pt eats much more healthy.  bp elevated today - attributes to migraine today. BP Readings from Last 3 Encounters:  03/23/12 142/82  01/13/12 138/86  05/18/11 144/82   Wt Readings from Last 3 Encounters:  03/23/12 246 lb (111.585 kg)  01/13/12 244 lb 12 oz (111.018 kg)  05/18/11 241 lb 12 oz (109.657 kg)  Body mass index is 31.57 kg/(m^2).   Transaminitis - very mild on blood work.  Occasional EtOH.  Rare tylenol.  Recently stopped aleve 2/2 GI upset.  Seat belt use discussed. Sunscreen use discussed.  Preventative:  CPE last 2007 with blood work. Did have several labs done in hospital, all except FLP.  Tetanus: 10/2010 Flu - 01/2012   Medications and allergies reviewed and updated in chart.  Past histories reviewed and updated if relevant as below. Patient Active Problem List  Diagnosis  . Panic attack  . Back pain  . Left knee pain  . Seasonal allergies  . Migraines  . Healthcare maintenance  . Family history of early CAD  . Marijuana smoker  . Urticaria   Past Medical History  Diagnosis  Date  . Seasonal allergies   . History of high blood pressure     readings- no dx of HTN  . Migraines   . Panic attack     worse mid 2012  . History of chicken pox   . Back pain   . Left knee pain    Past Surgical History  Procedure Laterality Date  . Appendectomy  1993   History  Substance Use Topics  . Smoking status: Never Smoker   . Smokeless tobacco: Never Used  . Alcohol Use: Yes     Comment: Occasionally-rare   Family History  Problem Relation Age of Onset  . Coronary artery disease Mother 67    CAD/MI  . Stroke Mother   . Hypertension Mother   . Osteoarthritis Mother   . Arthritis Maternal Grandmother   . Cancer Maternal Grandfather 40    prostate  . Kidney disease Maternal Grandfather   . Diabetes Neg Hx   . Cancer Other     great grandmother with lung and breast cnacer   No Known Allergies Current Outpatient Prescriptions on File Prior to Visit  Medication Sig Dispense Refill  . aspirin-acetaminophen-caffeine (EXCEDRIN MIGRAINE) 250-250-65 MG per tablet Take 2 tablets by mouth as needed.        . cyclobenzaprine (FLEXERIL) 5 MG tablet Take 1 tablet (5 mg total) by mouth 2 (two) times daily as needed for muscle spasms (migraines).  30 tablet  3  . loratadine (CLARITIN) 10 MG tablet Take 10 mg by mouth daily.      . SUMAtriptan (IMITREX) 50 MG tablet Take 1 tablet (50 mg total) by mouth daily as needed for migraine. May rpt 2nd dose in 2 hours if migraine not relieved  10 tablet  3   No current facility-administered medications on file prior to visit.    Review of Systems  Constitutional: Negative for fever, chills, activity change, appetite change, fatigue and unexpected weight change.  HENT: Negative for hearing loss and neck pain.   Eyes: Negative for visual disturbance.  Respiratory: Negative for cough, chest tightness, shortness of breath and wheezing.   Cardiovascular: Positive for chest pain (see HPI). Negative for palpitations and leg swelling.   Gastrointestinal: Negative for nausea, vomiting, abdominal pain, diarrhea, constipation, blood in stool and abdominal distention.  Genitourinary: Negative for hematuria and difficulty urinating.  Musculoskeletal: Negative for myalgias and arthralgias.  Skin: Negative for rash.  Neurological: Positive for headaches (migraines). Negative for dizziness, seizures and syncope.  Hematological: Does not bruise/bleed easily.  Psychiatric/Behavioral: Negative for dysphoric mood. The patient is nervous/anxious.        Objective:   Physical Exam  Nursing note and vitals reviewed. Constitutional: He is oriented to person, place, and time. He appears well-developed and well-nourished. No distress.  HENT:  Head: Normocephalic and atraumatic.  Right Ear: Hearing, tympanic membrane, external ear and ear canal normal.  Left Ear: Hearing, tympanic membrane, external ear and ear canal normal.  Nose: Nose normal.  Mouth/Throat: Oropharynx is clear and moist. No oropharyngeal exudate.  Eyes: Conjunctivae and EOM are normal. Pupils are equal, round, and reactive to light. No scleral icterus.  Neck: Normal range of motion. Neck supple. Carotid bruit is not present. No thyromegaly present.  Cardiovascular: Normal rate, regular rhythm, normal heart sounds and intact distal pulses.   No murmur heard. Pulses:      Radial pulses are 2+ on the right side, and 2+ on the left side.  Pulmonary/Chest: Effort normal and breath sounds normal. No respiratory distress. He has no wheezes. He has no rales.  Abdominal: Soft. Bowel sounds are normal. He exhibits no distension and no mass. There is no tenderness. There is no rebound and no guarding.  Musculoskeletal: Normal range of motion. He exhibits no edema.  No midline spine tenderness. + paraspinous cervical muscle tightness  Lymphadenopathy:    He has no cervical adenopathy.  Neurological: He is alert and oriented to person, place, and time.  CN grossly intact,  station and gait intact  Skin: Skin is warm and dry. No rash noted.  Psychiatric: He has a normal mood and affect. His behavior is normal. Judgment and thought content normal.      Assessment & Plan:

## 2012-06-13 ENCOUNTER — Ambulatory Visit: Payer: Self-pay | Admitting: Family Medicine

## 2012-06-15 ENCOUNTER — Other Ambulatory Visit (INDEPENDENT_AMBULATORY_CARE_PROVIDER_SITE_OTHER): Payer: BC Managed Care – PPO

## 2012-06-15 DIAGNOSIS — R7401 Elevation of levels of liver transaminase levels: Secondary | ICD-10-CM

## 2012-06-15 LAB — HEPATIC FUNCTION PANEL
Alkaline Phosphatase: 69 U/L (ref 39–117)
Bilirubin, Direct: 0 mg/dL (ref 0.0–0.3)

## 2012-06-22 ENCOUNTER — Encounter: Payer: Self-pay | Admitting: Family Medicine

## 2012-06-22 ENCOUNTER — Ambulatory Visit (INDEPENDENT_AMBULATORY_CARE_PROVIDER_SITE_OTHER): Payer: BC Managed Care – PPO | Admitting: Family Medicine

## 2012-06-22 VITALS — BP 142/86 | HR 60 | Temp 98.7°F | Wt 241.8 lb

## 2012-06-22 DIAGNOSIS — I10 Essential (primary) hypertension: Secondary | ICD-10-CM | POA: Insufficient documentation

## 2012-06-22 DIAGNOSIS — F410 Panic disorder [episodic paroxysmal anxiety] without agoraphobia: Secondary | ICD-10-CM

## 2012-06-22 DIAGNOSIS — R030 Elevated blood-pressure reading, without diagnosis of hypertension: Secondary | ICD-10-CM

## 2012-06-22 DIAGNOSIS — R7401 Elevation of levels of liver transaminase levels: Secondary | ICD-10-CM

## 2012-06-22 DIAGNOSIS — M2550 Pain in unspecified joint: Secondary | ICD-10-CM

## 2012-06-22 MED ORDER — HYDROXYZINE HCL 25 MG PO TABS: 25.0000 mg | ORAL_TABLET | Freq: Two times a day (BID) | ORAL | Status: DC | PRN

## 2012-06-22 NOTE — Patient Instructions (Signed)
Let's keep an eye on blood pressure as you've been doing.  Let us know if persistently elevated to discuss blood pressure medicine. In interim, work on weight - more aerobic exercise. Continue healthy diet. Good to see you today, call us with questions. Return as needed or in 9 months for physical.

## 2012-06-22 NOTE — Assessment & Plan Note (Signed)
Improved - recheck next blood draw, consider abd [place], iron panel and acute hepatitis panel.

## 2012-06-22 NOTE — Assessment & Plan Note (Signed)
Stable on hydroxyzine prn. Continue.

## 2012-06-22 NOTE — Assessment & Plan Note (Signed)
Does not sound like inflammatory arthritis - will continue to monitor.

## 2012-06-22 NOTE — Progress Notes (Signed)
  Subjective:    Patient ID: Derrick Santiago, male    DOB: 01-08-1981, 32 y.o.   MRN: [id number]  HPI CC:34mo f/u  Elevated BP today - bp at pharmacy has been 130s/80s.  Father with h/o HTN at age late 57s.  No vision changes, CP/tightness, SOB, leg swelling.   Wt Readings from Last 3 Encounters:  06/22/12 241 lb 12 oz (109.657 kg)  03/23/12 246 lb (111.585 kg)  01/13/12 244 lb 12 oz (111.018 kg)  trying to lose weight - more active, cutting back on red meat, avoiding fried foods.  Active with son playing baseball.  Drinks coffee, water, sweet tea.  Rare soda.  Fruits/vegetables daily.   Transaminitis - very mild on blood work. Occasional EtOH. Rare tylenol. Recently stopped aleve 2/2 GI upset.  Migraines - 1-2/month.  Has noted decreased frequency of regular headaches.  Anxiety - using hydroxyzine prn, every few days as needed.  Sometimes makes him sleepy.  Polyarthralgia - noted after strenuous exercise.  Neck, back, shoulders, elbows, hands, knees, feet.  On flexeril 5mg  as needed.  1 hour of morning stiffness that resolves with hot shower. No swelling of joints or redness or warmth.  Soy allergy.  Past Medical History  Diagnosis Date  . Seasonal allergies   . History of high blood pressure     readings- no dx of HTN  . Migraines   . Panic attack     worse mid 2012  . History of chicken pox   . Back pain   . Left knee pain   . History of hepatitis A 1993     Review of Systems Per HPI    Objective:   Physical Exam  Nursing note and vitals reviewed. Constitutional: He appears well-developed and well-nourished. No distress.  HENT:  Head: Normocephalic and atraumatic.  Mouth/Throat: Oropharynx is clear and moist. No oropharyngeal exudate.  Eyes: Conjunctivae and EOM are normal. Pupils are equal, round, and reactive to light. No scleral icterus.  Neck: Normal range of motion. Neck supple.  Cardiovascular: Normal rate, regular rhythm, normal heart sounds and intact  distal pulses.   No murmur heard. Pulmonary/Chest: Effort normal and breath sounds normal. No respiratory distress. He has no wheezes. He has no rales.  Skin: Skin is warm and dry. No rash noted.  Psychiatric: He has a normal mood and affect.       Assessment & Plan:

## 2012-06-22 NOTE — Assessment & Plan Note (Signed)
On recheck better BP.  Will not start med today - but advised him to continue to monitor at home and notify me if elevated. Discussed importance of weight loss, and low sodium diet to help keep BP under control.

## 2012-12-06 ENCOUNTER — Other Ambulatory Visit: Payer: Self-pay

## 2012-12-15 IMAGING — CT CT ANGIO CHEST
2 of 4 series · 19 of 46 positions shown · IV contrast (agent unspecified)
Comparison: None.

CLINICAL DATA: Chest pain and shortness of breath.

CT ANGIOGRAPHY CHEST WITH CONTRAST
TECHNIQUE: Multidetector CT imaging of the chest was performed
using the standard protocol during bolus administration of
intravenous contrast.  Multiplanar CT image reconstructions
including MIPs were obtained to evaluate the vascular anatomy.
Contrast:  100 ml 7mnipaque-DRR

[Series 9: pulm embolism 1.0 b25f thin · axial · 0.70mm/px · z∈[+1266,+1539]mm · 16 of 301 slices shown]
[im 14/301  lung]
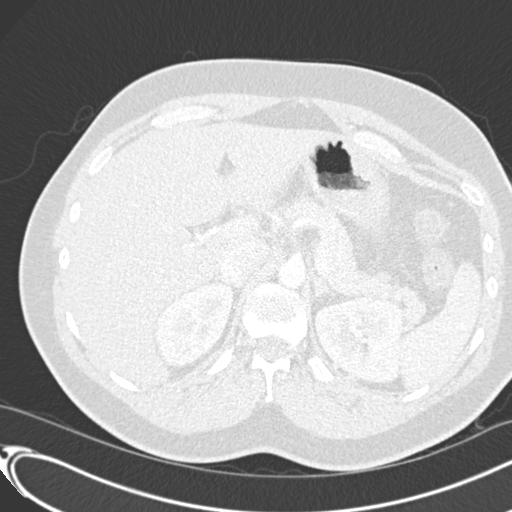
[im 40/301  soft-tissue]
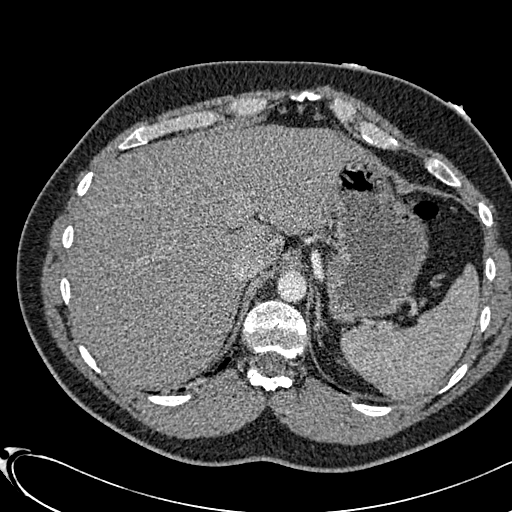
[im 53/301  lung]
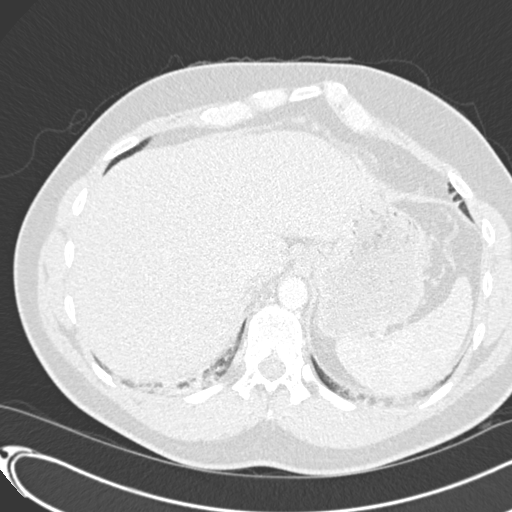
[im 66/301  soft-tissue]
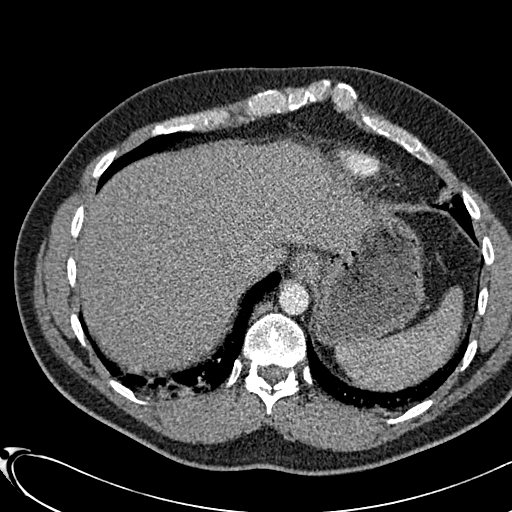
[im 92/301  lung]
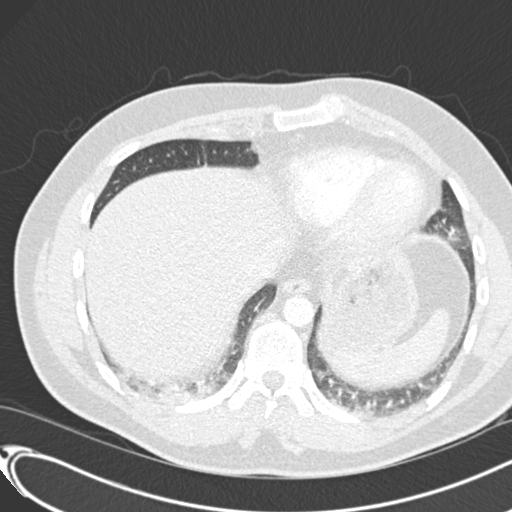
[im 105/301  soft-tissue]
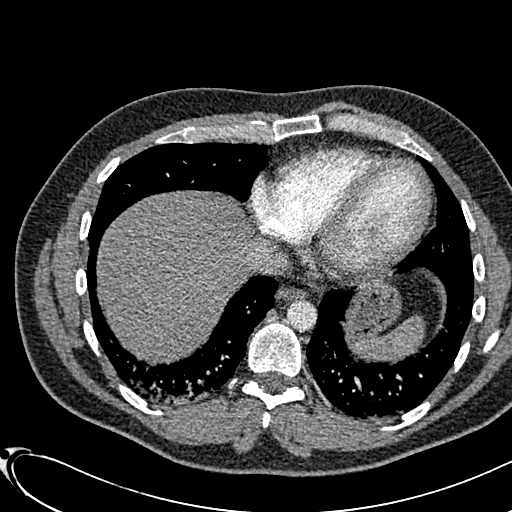
[im 118/301  lung]
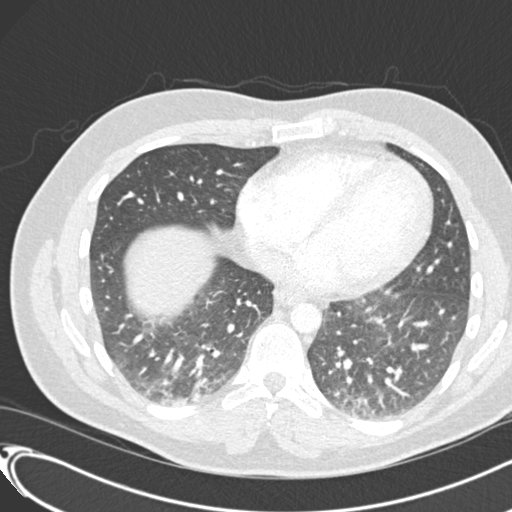
[im 144/301  soft-tissue]
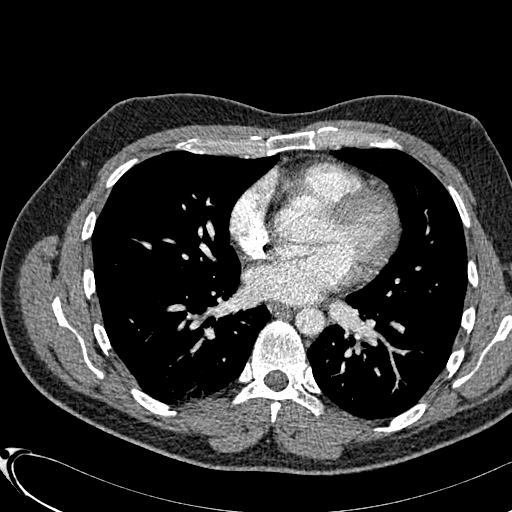
[im 157/301  lung]
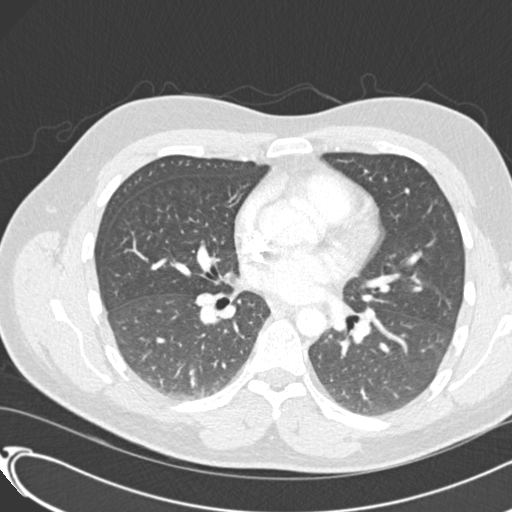
[im 183/301  soft-tissue]
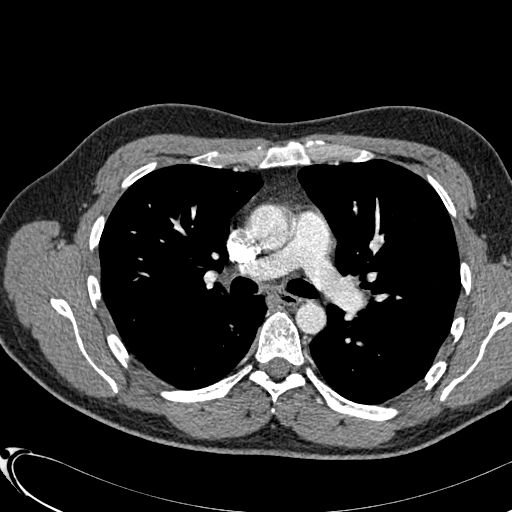
[im 196/301  lung]
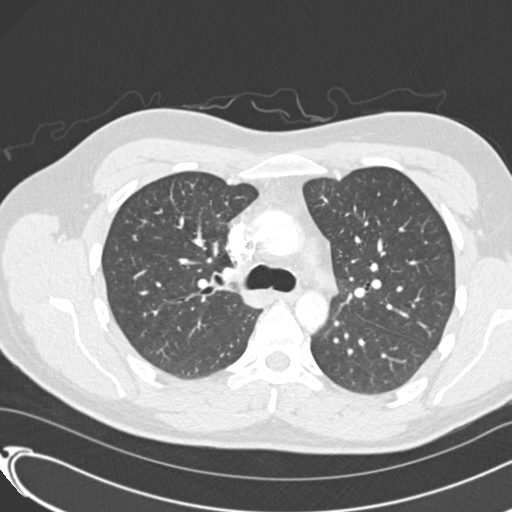
[im 209/301  soft-tissue]
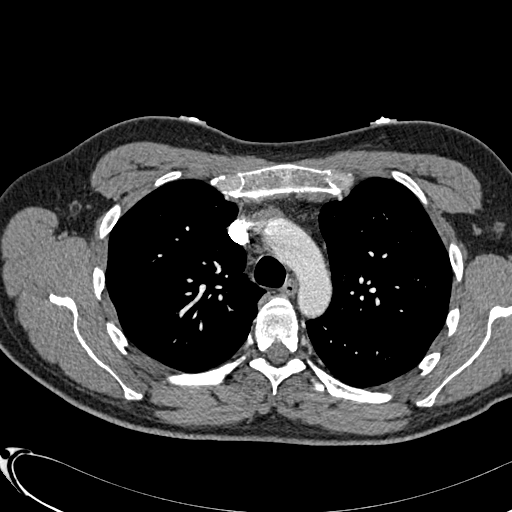
[im 235/301  lung]
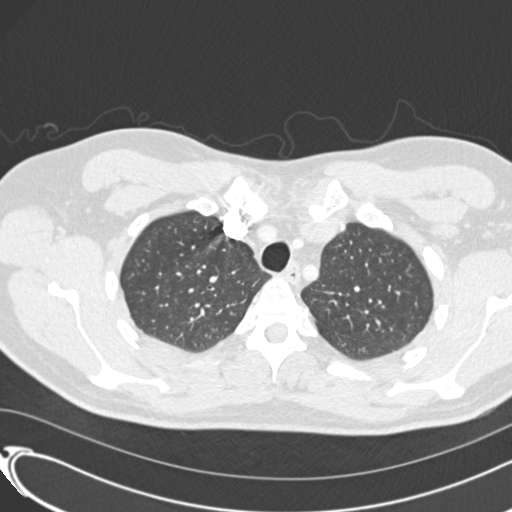
[im 248/301  soft-tissue]
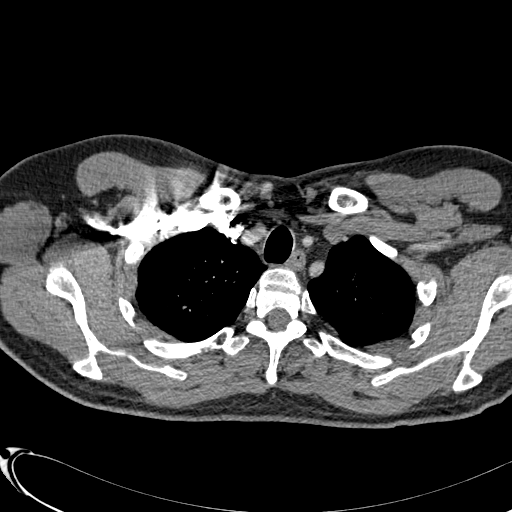
[im 261/301  lung]
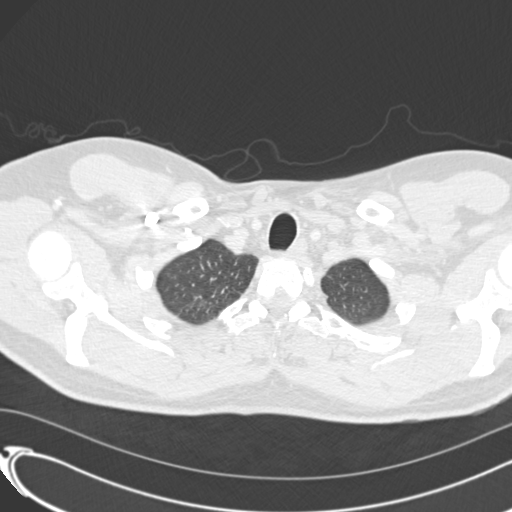
[im 287/301  soft-tissue]
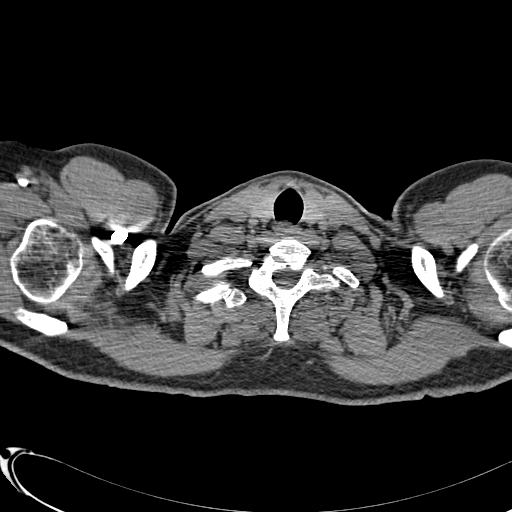

[Series 602: cor · coronal · 0.70mm/px · 3 of 136 slices shown]
[im 46/136  soft-tissue]
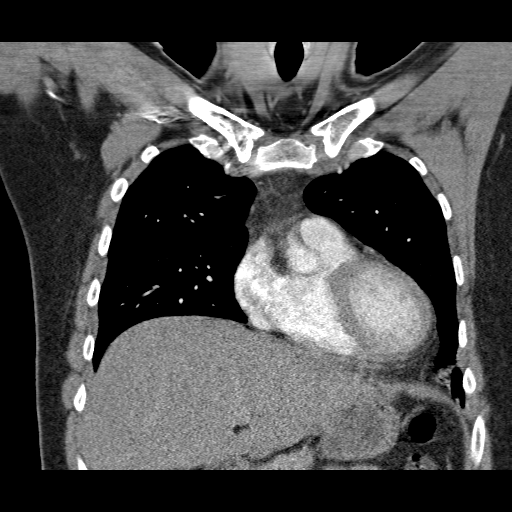
[im 61/136  soft-tissue]
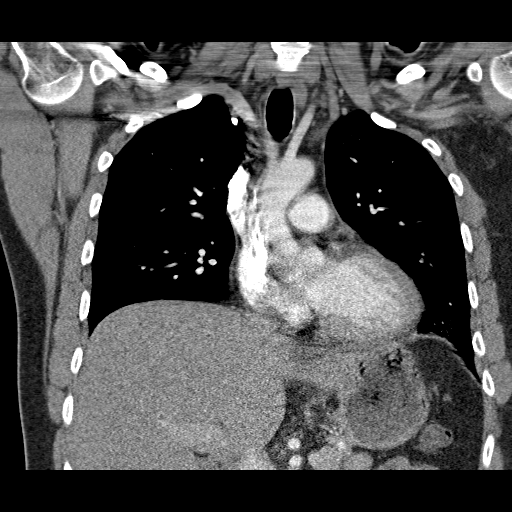
[im 76/136  soft-tissue]
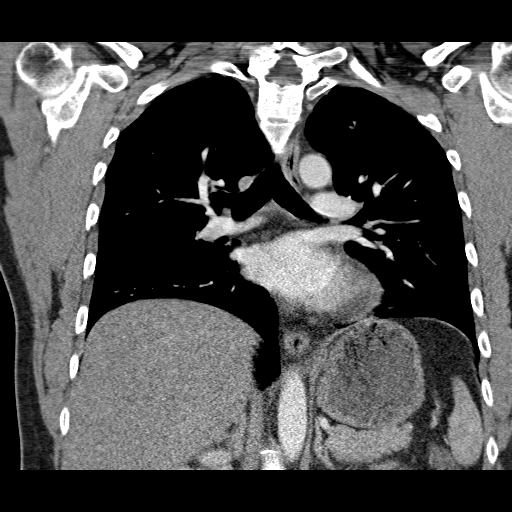

[19 of 46 positions shown; findings below may reference images not displayed]

FINDINGS: Satisfactory opacification of the pulmonary arteries
noted, and there is no evidence of pulmonary emboli.  No evidence
of thoracic aortic aneurysm or dissection.  No evidence of
mediastinal or hilar soft tissue masses.  No lymphadenopathy seen
elsewhere within the thorax.

No evidence of pleural or pericardial effusion.  Mild dependent
atelectasis seen in both lower lobes, but there is no evidence of
pulmonary consolidation or mass.  No evidence of endobronchial
lesion. A small calcified granuloma seen in the right lung apex.

Review of the MIP images confirms the above findings.
IMPRESSION: 1.  No evidence of pulmonary embolism.
2.  Mild dependent bilateral lower lobe atelectasis.

## 2013-03-17 ENCOUNTER — Other Ambulatory Visit: Payer: Self-pay | Admitting: Family Medicine

## 2013-03-17 DIAGNOSIS — R740 Nonspecific elevation of levels of transaminase and lactic acid dehydrogenase [LDH]: Secondary | ICD-10-CM

## 2013-03-17 DIAGNOSIS — R7401 Elevation of levels of liver transaminase levels: Secondary | ICD-10-CM

## 2013-03-17 DIAGNOSIS — Z8249 Family history of ischemic heart disease and other diseases of the circulatory system: Secondary | ICD-10-CM

## 2013-03-18 ENCOUNTER — Other Ambulatory Visit (INDEPENDENT_AMBULATORY_CARE_PROVIDER_SITE_OTHER): Payer: BC Managed Care – PPO

## 2013-03-18 DIAGNOSIS — Z0000 Encounter for general adult medical examination without abnormal findings: Secondary | ICD-10-CM

## 2013-03-18 DIAGNOSIS — R7402 Elevation of levels of lactic acid dehydrogenase (LDH): Secondary | ICD-10-CM

## 2013-03-18 DIAGNOSIS — Z8249 Family history of ischemic heart disease and other diseases of the circulatory system: Secondary | ICD-10-CM

## 2013-03-18 DIAGNOSIS — R030 Elevated blood-pressure reading, without diagnosis of hypertension: Secondary | ICD-10-CM

## 2013-03-18 DIAGNOSIS — R740 Nonspecific elevation of levels of transaminase and lactic acid dehydrogenase [LDH]: Secondary | ICD-10-CM

## 2013-03-18 DIAGNOSIS — R7401 Elevation of levels of liver transaminase levels: Secondary | ICD-10-CM

## 2013-03-18 LAB — LIPID PANEL
CHOLESTEROL: 195 mg/dL (ref 0–200)
HDL: 42.1 mg/dL (ref >39.00)
LDL CALC: 116 mg/dL — AB (ref 0–99)
Total CHOL/HDL Ratio: 5
Triglycerides: 184 mg/dL — ABNORMAL HIGH (ref 0.0–149.0)
VLDL: 36.8 mg/dL (ref 0.0–40.0)

## 2013-03-18 LAB — COMPREHENSIVE METABOLIC PANEL
ALBUMIN: 4.2 g/dL (ref 3.5–5.2)
ALT: 52 U/L (ref 0–53)
AST: 22 U/L (ref 0–37)
Alkaline Phosphatase: 67 U/L (ref 39–117)
BUN: 12 mg/dL (ref 6–23)
CALCIUM: 9.3 mg/dL (ref 8.4–10.5)
CHLORIDE: 105 meq/L (ref 96–112)
CO2: 27 mEq/L (ref 19–32)
Creatinine, Ser: 0.9 mg/dL (ref 0.4–1.5)
GFR: 108.99 mL/min (ref >60.00)
Glucose, Bld: 110 mg/dL — ABNORMAL HIGH (ref 70–99)
POTASSIUM: 3.8 meq/L (ref 3.5–5.1)
Sodium: 139 mEq/L (ref 135–145)
Total Bilirubin: 0.8 mg/dL (ref 0.3–1.2)
Total Protein: 7.2 g/dL (ref 6.0–8.3)

## 2013-03-18 LAB — IBC PANEL
Iron: 131 ug/dL (ref 42–165)
Saturation Ratios: 41.9 % (ref 20.0–50.0)
TRANSFERRIN: 223.5 mg/dL (ref 212.0–360.0)

## 2013-03-19 LAB — HEPATITIS PANEL, ACUTE
HCV Ab: NEGATIVE
HEP A IGM: NONREACTIVE
HEP B C IGM: NONREACTIVE
Hepatitis B Surface Ag: NEGATIVE

## 2013-03-25 ENCOUNTER — Ambulatory Visit (INDEPENDENT_AMBULATORY_CARE_PROVIDER_SITE_OTHER): Payer: BC Managed Care – PPO | Admitting: Family Medicine

## 2013-03-25 ENCOUNTER — Encounter: Payer: Self-pay | Admitting: Family Medicine

## 2013-03-25 VITALS — BP 140/80 | HR 80 | Temp 98.4°F | Ht 74.0 in | Wt 244.8 lb

## 2013-03-25 DIAGNOSIS — Z0000 Encounter for general adult medical examination without abnormal findings: Secondary | ICD-10-CM

## 2013-03-25 DIAGNOSIS — M542 Cervicalgia: Secondary | ICD-10-CM | POA: Insufficient documentation

## 2013-03-25 DIAGNOSIS — R7402 Elevation of levels of lactic acid dehydrogenase (LDH): Secondary | ICD-10-CM

## 2013-03-25 DIAGNOSIS — G43909 Migraine, unspecified, not intractable, without status migrainosus: Secondary | ICD-10-CM

## 2013-03-25 DIAGNOSIS — R7401 Elevation of levels of liver transaminase levels: Secondary | ICD-10-CM

## 2013-03-25 DIAGNOSIS — R740 Nonspecific elevation of levels of transaminase and lactic acid dehydrogenase [LDH]: Secondary | ICD-10-CM

## 2013-03-25 DIAGNOSIS — F410 Panic disorder [episodic paroxysmal anxiety] without agoraphobia: Secondary | ICD-10-CM

## 2013-03-25 NOTE — Assessment & Plan Note (Signed)
Ongoing.  No red flags - no radiculopathy, no paresthesias or weakness.  FROM at neck.  [facility]

## 2013-03-25 NOTE — Assessment & Plan Note (Signed)
Preventative protocols reviewed and updated unless pt declined. Discussed healthy diet and lifestyle.  

## 2013-03-25 NOTE — Patient Instructions (Addendum)
Good to see you today, call us with questions. Watch triglyceride levels and sugar control - watch added sugars. Continue regular exercise. Return in 1 year for next physical.

## 2013-03-25 NOTE — Assessment & Plan Note (Signed)
Stable with rare prn hydroxyzine use.

## 2013-03-25 NOTE — Assessment & Plan Note (Signed)
Flexeril too sedating. Stable on excedrin and imitrex prn for abortive therapy 1-2 migraines per month.

## 2013-03-25 NOTE — Progress Notes (Signed)
Pre visit review using our clinic review tool, if applicable. No additional management support is needed unless otherwise documented below in the visit note. 

## 2013-03-25 NOTE — Assessment & Plan Note (Signed)
This has resolved. Will continue to monitor.  If again elevated, would consider abd US.

## 2013-03-25 NOTE — Progress Notes (Signed)
BP 140/80  Pulse 80  Temp(Src) 98.4 F (36.9 C) (Oral)  Ht 6\' 2"  (1.88 m)  Wt 244 lb 12 oz (111.018 kg)  BMI 31.41 kg/m2   CC: annual exam  Subjective:    Patient ID: Derrick Santiago, male    DOB: 1980-10-18, 33 y.o.   MRN: [id number]  HPI: Derrick SaxRickey L Fish is a 33 y.o. male presenting on 03/25/2013 with Annual Exam   Pt has increased exercise - strength training and cardio.   Anxiety - prn hydroxyzine, minimal use.  Has noticed improvement of anxiety with increased exercise. Migraine - 1-2 per month. Intermittent neck pain - ongoing issue.  Mid neck.  Flexeril causes sedation.  Takes aleve 1 per day.  Seat belt use discussed.  Sunscreen use discussed. No sunburns in last year.  Preventative:  Tetanus: 10/2010  Flu - declines  Wt Readings from Last 3 Encounters:  03/25/13 244 lb 12 oz (111.018 kg)  06/22/12 241 lb 12 oz (109.657 kg)  03/23/12 246 lb (111.585 kg)  Body mass index is 31.41 kg/(m^2).   Relevant past medical, surgical, family and social history reviewed and updated as indicated.  Allergies and medications reviewed and updated. Current Outpatient Prescriptions on File Prior to Visit  Medication Sig  . aspirin-acetaminophen-caffeine (EXCEDRIN MIGRAINE) 250-250-65 MG per tablet Take 2 tablets by mouth as needed.    . hydrOXYzine (ATARAX/VISTARIL) 25 MG tablet Take 1 tablet (25 mg total) by mouth 2 (two) times daily as needed for anxiety (sedation precautions).  . loratadine (CLARITIN) 10 MG tablet Take 10 mg by mouth daily.  . Multiple Vitamin (MULTIVITAMIN) tablet Take 1 tablet by mouth daily.  . naproxen sodium (ANAPROX) 220 MG tablet Take 220 mg by mouth daily.  . SUMAtriptan (IMITREX) 50 MG tablet Take 1 tablet (50 mg total) by mouth daily as needed for migraine. May rpt 2nd dose in 2 hours if migraine not relieved   No current facility-administered medications on file prior to visit.    Review of Systems  Constitutional: Negative for fever,  chills, activity change, appetite change, fatigue and unexpected weight change.  HENT: Negative for hearing loss.   Eyes: Negative for visual disturbance.  Respiratory: Negative for cough, chest tightness, shortness of breath and wheezing.   Cardiovascular: Positive for chest pain (occasional discomfort - due to anxiety). Negative for palpitations and leg swelling.  Gastrointestinal: Negative for nausea, vomiting, abdominal pain, diarrhea, constipation, blood in stool and abdominal distention.  Genitourinary: Negative for hematuria and difficulty urinating.  Musculoskeletal: Positive for neck pain (occasional). Negative for arthralgias and myalgias.  Skin: Negative for rash.  Neurological: Positive for headaches. Negative for dizziness, seizures and syncope.  Hematological: Negative for adenopathy. Does not bruise/bleed easily.  Psychiatric/Behavioral: Negative for dysphoric mood. The patient is nervous/anxious (much better).    Per HPI unless specifically indicated above    Objective:    BP 140/80  Pulse 80  Temp(Src) 98.4 F (36.9 C) (Oral)  Ht 6\' 2"  (1.88 m)  Wt 244 lb 12 oz (111.018 kg)  BMI 31.41 kg/m2  Physical Exam  Nursing note and vitals reviewed. Constitutional: He is oriented to person, place, and time. He appears well-developed and well-nourished. No distress.  HENT:  Head: Normocephalic and atraumatic.  Right Ear: Hearing, tympanic membrane, external ear and ear canal normal.  Left Ear: Hearing, tympanic membrane, external ear and ear canal normal.  Nose: Nose normal.  Mouth/Throat: Uvula is midline, oropharynx is clear and moist and mucous membranes  are normal. No oropharyngeal exudate, posterior oropharyngeal edema or posterior oropharyngeal erythema.  Eyes: Conjunctivae and EOM are normal. Pupils are equal, round, and reactive to light. No scleral icterus.  Neck: Normal range of motion. Neck supple. No thyromegaly present.  Cardiovascular: Normal rate, regular  rhythm, normal heart sounds and intact distal pulses.   No murmur heard. Pulses:      Radial pulses are 2+ on the right side, and 2+ on the left side.  Pulmonary/Chest: Effort normal and breath sounds normal. No respiratory distress. He has no wheezes. He has no rales.  Abdominal: Soft. Bowel sounds are normal. He exhibits no distension and no mass. There is no tenderness. There is no rebound and no guarding.  Musculoskeletal: Normal range of motion. He exhibits no edema.  Lymphadenopathy:    He has no cervical adenopathy.  Neurological: He is alert and oriented to person, place, and time.  CN grossly intact, station and gait intact  Skin: Skin is warm and dry. No rash noted.  Psychiatric: He has a normal mood and affect. His behavior is normal. Judgment and thought content normal.   Results for orders placed in visit on 03/18/13  LIPID PANEL      Result Value Ref Range   Cholesterol 195  0 - 200 mg/dL   Triglycerides 161.0 (*) 0.0 - 149.0 mg/dL   HDL 96.04  >54.09 mg/dL   VLDL 81.1  0.0 - 91.4 mg/dL   LDL Cholesterol 782 (*) 0 - 99 mg/dL   Total CHOL/HDL Ratio 5    COMPREHENSIVE METABOLIC PANEL      Result Value Ref Range   Sodium 139  135 - 145 mEq/L   Potassium 3.8  3.5 - 5.1 mEq/L   Chloride 105  96 - 112 mEq/L   CO2 27  19 - 32 mEq/L   Glucose, Bld 110 (*) 70 - 99 mg/dL   BUN 12  6 - 23 mg/dL   Creatinine, Ser 0.9  0.4 - 1.5 mg/dL   Total Bilirubin 0.8  0.3 - 1.2 mg/dL   Alkaline Phosphatase 67  39 - 117 U/L   AST 22  0 - 37 U/L   ALT 52  0 - 53 U/L   Total Protein 7.2  6.0 - 8.3 g/dL   Albumin 4.2  3.5 - 5.2 g/dL   Calcium 9.3  8.4 - 95.6 mg/dL   GFR 213.08  >65.78 mL/min  IBC PANEL      Result Value Ref Range   Iron 131  42 - 165 ug/dL   Transferrin 469.6  295.2 - 360.0 mg/dL   Saturation Ratios 84.1  20.0 - 50.0 %  HEPATITIS PANEL, ACUTE      Result Value Ref Range   Hepatitis B Surface Ag NEGATIVE  NEGATIVE   HCV Ab NEGATIVE  NEGATIVE   Hep B C IgM NON  REACTIVE  NON REACTIVE   Hep A IgM NON REACTIVE  NON REACTIVE      Assessment & Plan:   Problem List Items Addressed This Visit   Healthcare maintenance - Primary     Preventative protocols reviewed and updated unless pt declined. Discussed healthy diet and lifestyle.     Migraines     Flexeril too sedating. Stable on excedrin and imitrex prn for abortive therapy 1-2 migraines per month.    Neck pain     Ongoing.  No red flags - no radiculopathy, no paresthesias or weakness.  FROM at neck.  Provided with  neck stretching exercises from [facility] pt advisor     Panic attack     Stable with rare prn hydroxyzine use.    Transaminitis     This has resolved. Will continue to monitor.  If again elevated, would consider abd [place].        Follow up plan: Return in about 1 year (around 03/25/2014), or as needed, for physical.

## 2014-03-17 ENCOUNTER — Other Ambulatory Visit: Payer: Self-pay | Admitting: Family Medicine

## 2014-03-17 DIAGNOSIS — R740 Nonspecific elevation of levels of transaminase and lactic acid dehydrogenase [LDH]: Secondary | ICD-10-CM

## 2014-03-17 DIAGNOSIS — E785 Hyperlipidemia, unspecified: Secondary | ICD-10-CM | POA: Insufficient documentation

## 2014-03-17 DIAGNOSIS — R7401 Elevation of levels of liver transaminase levels: Secondary | ICD-10-CM

## 2014-03-17 DIAGNOSIS — E781 Pure hyperglyceridemia: Secondary | ICD-10-CM

## 2014-03-18 ENCOUNTER — Other Ambulatory Visit (INDEPENDENT_AMBULATORY_CARE_PROVIDER_SITE_OTHER): Payer: BLUE CROSS/BLUE SHIELD

## 2014-03-18 DIAGNOSIS — E781 Pure hyperglyceridemia: Secondary | ICD-10-CM

## 2014-03-18 DIAGNOSIS — R740 Nonspecific elevation of levels of transaminase and lactic acid dehydrogenase [LDH]: Secondary | ICD-10-CM

## 2014-03-18 DIAGNOSIS — R7401 Elevation of levels of liver transaminase levels: Secondary | ICD-10-CM

## 2014-03-18 LAB — LIPID PANEL
Cholesterol: 217 mg/dL — ABNORMAL HIGH (ref 0–200)
HDL: 37.4 mg/dL — ABNORMAL LOW (ref >39.00)
LDL CALC: 148 mg/dL — AB (ref 0–99)
NONHDL: 179.6
Total CHOL/HDL Ratio: 6
Triglycerides: 158 mg/dL — ABNORMAL HIGH (ref 0.0–149.0)
VLDL: 31.6 mg/dL (ref 0.0–40.0)

## 2014-03-18 LAB — HEPATIC FUNCTION PANEL
ALK PHOS: 74 U/L (ref 39–117)
ALT: 65 U/L — ABNORMAL HIGH (ref 0–53)
AST: 30 U/L (ref 0–37)
Albumin: 4.4 g/dL (ref 3.5–5.2)
BILIRUBIN DIRECT: 0.1 mg/dL (ref 0.0–0.3)
TOTAL PROTEIN: 7.5 g/dL (ref 6.0–8.3)
Total Bilirubin: 0.5 mg/dL (ref 0.2–1.2)

## 2014-03-18 LAB — BASIC METABOLIC PANEL
BUN: 13 mg/dL (ref 6–23)
CO2: 27 mEq/L (ref 19–32)
Calcium: 9.6 mg/dL (ref 8.4–10.5)
Chloride: 105 mEq/L (ref 96–112)
Creatinine, Ser: 1.07 mg/dL (ref 0.40–1.50)
GFR: 83.09 mL/min (ref >60.00)
GLUCOSE: 113 mg/dL — AB (ref 70–99)
Potassium: 4 mEq/L (ref 3.5–5.1)
Sodium: 139 mEq/L (ref 135–145)

## 2014-03-25 ENCOUNTER — Encounter: Payer: BC Managed Care – PPO | Admitting: Family Medicine

## 2014-03-25 ENCOUNTER — Ambulatory Visit (INDEPENDENT_AMBULATORY_CARE_PROVIDER_SITE_OTHER): Payer: BLUE CROSS/BLUE SHIELD | Admitting: Family Medicine

## 2014-03-25 ENCOUNTER — Encounter: Payer: Self-pay | Admitting: Family Medicine

## 2014-03-25 VITALS — BP 130/80 | HR 64 | Temp 98.1°F | Ht 74.0 in | Wt 251.5 lb

## 2014-03-25 DIAGNOSIS — R7401 Elevation of levels of liver transaminase levels: Secondary | ICD-10-CM

## 2014-03-25 DIAGNOSIS — R030 Elevated blood-pressure reading, without diagnosis of hypertension: Secondary | ICD-10-CM

## 2014-03-25 DIAGNOSIS — E669 Obesity, unspecified: Secondary | ICD-10-CM | POA: Insufficient documentation

## 2014-03-25 DIAGNOSIS — Z23 Encounter for immunization: Secondary | ICD-10-CM

## 2014-03-25 DIAGNOSIS — E785 Hyperlipidemia, unspecified: Secondary | ICD-10-CM

## 2014-03-25 DIAGNOSIS — E66811 Obesity, class 1: Secondary | ICD-10-CM | POA: Insufficient documentation

## 2014-03-25 DIAGNOSIS — G43809 Other migraine, not intractable, without status migrainosus: Secondary | ICD-10-CM

## 2014-03-25 DIAGNOSIS — Z0000 Encounter for general adult medical examination without abnormal findings: Secondary | ICD-10-CM

## 2014-03-25 DIAGNOSIS — R740 Nonspecific elevation of levels of transaminase and lactic acid dehydrogenase [LDH]: Secondary | ICD-10-CM

## 2014-03-25 DIAGNOSIS — F410 Panic disorder [episodic paroxysmal anxiety] without agoraphobia: Secondary | ICD-10-CM

## 2014-03-25 MED ORDER — SUMATRIPTAN SUCCINATE 50 MG PO TABS: 50.0000 mg | ORAL_TABLET | Freq: Every day | ORAL | Status: DC | PRN

## 2014-03-25 NOTE — Assessment & Plan Note (Signed)
Well controlled on hydroxyzine prn.

## 2014-03-25 NOTE — Patient Instructions (Signed)
Return as needed or in 1 year for next physical. Work on increased regular exercise in routine, and healthy diet changes - a fruit or vegetable with each meal, watch portion sizes and watch carb intake. Call us with questions

## 2014-03-25 NOTE — Progress Notes (Signed)
Pre visit review using our clinic review tool, if applicable. No additional management support is needed unless otherwise documented below in the visit note. 

## 2014-03-25 NOTE — Assessment & Plan Note (Signed)
Continue to monitor at home and notify us if persistently elevated. Pt states well controlled at home - no treatment currently indicated.

## 2014-03-25 NOTE — Assessment & Plan Note (Signed)
Recurrent - anticipate fatty liver. If persistent, check abd [place].

## 2014-03-25 NOTE — Assessment & Plan Note (Signed)
Reviewed goals esp LDL <100 given fmhx. Pt will continue working towards weight loss as means of controlling cholesterol levels.

## 2014-03-25 NOTE — Assessment & Plan Note (Signed)
Discussed healthy diet and lifestyle changes to affect sustainable weight loss. Discussed incorporating exercise into routine. Reviewed diet choices including avoiding added sugars and sweetened beverages. Discussed EtOH intake and carb load of beer.

## 2014-03-25 NOTE — Assessment & Plan Note (Signed)
PRN excedrin and imitrex

## 2014-03-25 NOTE — Assessment & Plan Note (Signed)
Preventative protocols reviewed and updated unless pt declined. Discussed healthy diet and lifestyle.  

## 2014-03-25 NOTE — Progress Notes (Signed)
BP 130/80 mmHg  Pulse 64  Temp(Src) 98.1 F (36.7 C) (Oral)  Ht 6\' 2"  (1.88 m)  Wt 251 lb 8 oz (114.08 kg)  BMI 32.28 kg/m2   CC: CPE  Subjective:    Patient ID: Derrick Santiago L [place], male    DOB: 02-Jan-1981, 34 y.o.   MRN: [id number]  HPI: Derrick Santiago is a 34 y.o. male presenting on 03/25/2014 for Annual Exam   BP elevated today - at home runs 125-130/80. He does have HA this morning.  [place].   Anxiety - prn hydroxyzine, minimal use. Has noticed improvement of anxiety with increased exercise.  Migraine - 1-2 per month. Treated with excedrin and imitrex prn.  Intermittent neck pain - ongoing issue. Mid neck. Takes aleve 1 per day. Flexeril causes oversedation.  Pt has increased exercise - strength training and cardio.  Considering buying treadmill.   Preventative: Tetanus: 10/2010 Flu - today  Seat belt use discussed.  Sunscreen use discussed. No suspicious moles on skin.   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 Interim medical history since our last visit reviewed. Allergies and medications reviewed and updated. Current Outpatient Prescriptions on File Prior to Visit  Medication Sig  . aspirin-acetaminophen-caffeine (EXCEDRIN MIGRAINE) 250-250-65 MG per tablet Take 2 tablets by mouth as needed.    . hydrOXYzine (ATARAX/VISTARIL) 25 MG tablet Take 1 tablet (25 mg total) by mouth 2 (two) times daily as needed for anxiety (sedation precautions).  . loratadine (CLARITIN) 10 MG tablet Take 10 mg by mouth daily.  . Multiple Vitamin (MULTIVITAMIN) tablet Take 1 tablet by mouth daily.  . naproxen sodium (ANAPROX) 220 MG tablet Take 220 mg by mouth daily.   No current facility-administered medications  on file prior to visit.    Review of Systems  Constitutional: Negative for fever, chills, activity change, appetite change, fatigue and unexpected weight change.  HENT: Negative for hearing loss.   Eyes: Negative for visual disturbance.  Respiratory: Negative for cough, chest tightness, shortness of breath and wheezing.   Cardiovascular: Negative for chest pain, palpitations and leg swelling.  Gastrointestinal: Negative for nausea, vomiting, abdominal pain, diarrhea, constipation, blood in stool and abdominal distention.  Genitourinary: Negative for hematuria and difficulty urinating.  Musculoskeletal: Negative for myalgias, arthralgias and neck pain.  Skin: Negative for rash.  Neurological: Positive for headaches. Negative for dizziness, seizures and syncope.  Hematological: Negative for adenopathy. Does not bruise/bleed easily.  Psychiatric/Behavioral: Negative for dysphoric mood. The patient is not nervous/anxious.    Per HPI unless specifically indicated above     Objective:    BP 130/80 mmHg  Pulse 64  Temp(Src) 98.1 F (36.7 C) (Oral)  Ht 6\' 2"  (1.88 m)  Wt 251 lb 8 oz (114.08 kg)  BMI 32.28 kg/m2  Wt Readings from Last 3 Encounters:  03/25/14 251 lb 8 oz (114.08 kg)  03/25/13 244 lb 12 oz (111.018 kg)  06/22/12 241 lb 12 oz (109.657 kg)    Physical Exam  Constitutional: He is oriented to person, place, and time. He appears well-developed and well-nourished. No distress.  HENT:  Head: Normocephalic and atraumatic.  Right Ear: Hearing, tympanic membrane, external ear and ear canal normal.  Left Ear: Hearing, tympanic membrane, external ear  and ear canal normal.  Nose: Nose normal.  Mouth/Throat: Uvula is midline, oropharynx is clear and moist and mucous membranes are normal. No oropharyngeal exudate, posterior oropharyngeal edema or posterior oropharyngeal erythema.  Eyes: Conjunctivae and EOM are normal. Pupils are equal, round, and reactive to light. No scleral  icterus.  Neck: Normal range of motion. Neck supple.  Cardiovascular: Normal rate, regular rhythm, normal heart sounds and intact distal pulses.   No murmur heard. Pulses:      Radial pulses are 2+ on the right side, and 2+ on the left side.  Pulmonary/Chest: Effort normal and breath sounds normal. No respiratory distress. He has no wheezes. He has no rales.  Abdominal: Soft. Bowel sounds are normal. He exhibits no distension and no mass. There is no tenderness. There is no rebound and no guarding.  Musculoskeletal: Normal range of motion. He exhibits no edema.  Lymphadenopathy:    He has no cervical adenopathy.  Neurological: He is alert and oriented to person, place, and time.  CN grossly intact, station and gait intact  Skin: Skin is warm and dry. No rash noted.  Psychiatric: He has a normal mood and affect. His behavior is normal. Judgment and thought content normal.  Nursing note and vitals reviewed.  Results for orders placed or performed in visit on 03/18/14  Lipid panel  Result Value Ref Range   Cholesterol 217 (H) 0 - 200 mg/dL   Triglycerides 161.0 (H) 0.0 - 149.0 mg/dL   HDL 96.04 (L) >54.09 mg/dL   VLDL 81.1 0.0 - 91.4 mg/dL   LDL Cholesterol 782 (H) 0 - 99 mg/dL   Total CHOL/HDL Ratio 6    NonHDL 179.60   Basic metabolic panel  Result Value Ref Range   Sodium 139 135 - 145 mEq/L   Potassium 4.0 3.5 - 5.1 mEq/L   Chloride 105 96 - 112 mEq/L   CO2 27 19 - 32 mEq/L   Glucose, Bld 113 (H) 70 - 99 mg/dL   BUN 13 6 - 23 mg/dL   Creatinine, Ser 9.56 0.40 - 1.50 mg/dL   Calcium 9.6 8.4 - 21.3 mg/dL   GFR 08.65 >78.46 mL/min  Hepatic Function Panel  Result Value Ref Range   Total Bilirubin 0.5 0.2 - 1.2 mg/dL   Bilirubin, Direct 0.1 0.0 - 0.3 mg/dL   Alkaline Phosphatase 74 39 - 117 U/L   AST 30 0 - 37 U/L   ALT 65 (H) 0 - 53 U/L   Total Protein 7.5 6.0 - 8.3 g/dL   Albumin 4.4 3.5 - 5.2 g/dL      Assessment & Plan:   Problem List Items Addressed This Visit     Transaminitis    Recurrent - anticipate fatty liver. If persistent, check abd [place].      Panic attack    Well controlled on hydroxyzine prn.      Obesity    Discussed healthy diet and lifestyle changes to affect sustainable weight loss. Discussed incorporating exercise into routine. Reviewed diet choices including avoiding added sugars and sweetened beverages. Discussed EtOH intake and carb load of beer.      Migraines    PRN excedrin and imitrex      Relevant Medications   SUMAtriptan (IMITREX) tablet   Healthcare maintenance - Primary    Preventative protocols reviewed and updated unless pt declined. Discussed healthy diet and lifestyle.       Elevated blood pressure reading without diagnosis of hypertension    Continue to  monitor at home and notify us if persistently elevated. Pt states well controlled at home - no treatment currently indicated.      Dyslipidemia    Reviewed goals esp LDL <100 given fmhx. Pt will continue working towards weight loss as means of controlling cholesterol levels.       Other Visit Diagnoses    Need for influenza vaccination        Relevant Orders    Flu Vaccine QUAD 36+ mos PF IM (Fluarix Quad PF) (Completed)        Follow up plan: Return in about 1 year (around 03/26/2015), or as needed, for annual exam, prior fasting for blood work.

## 2015-03-26 ENCOUNTER — Other Ambulatory Visit: Payer: Self-pay | Admitting: Family Medicine

## 2015-03-26 ENCOUNTER — Other Ambulatory Visit (INDEPENDENT_AMBULATORY_CARE_PROVIDER_SITE_OTHER): Payer: BLUE CROSS/BLUE SHIELD

## 2015-03-26 DIAGNOSIS — E785 Hyperlipidemia, unspecified: Secondary | ICD-10-CM

## 2015-03-26 DIAGNOSIS — R740 Nonspecific elevation of levels of transaminase and lactic acid dehydrogenase [LDH]: Secondary | ICD-10-CM | POA: Diagnosis not present

## 2015-03-26 DIAGNOSIS — R7401 Elevation of levels of liver transaminase levels: Secondary | ICD-10-CM

## 2015-03-26 LAB — LIPID PANEL
CHOL/HDL RATIO: 5
CHOLESTEROL: 209 mg/dL — AB (ref 0–200)
HDL: 44.5 mg/dL (ref >39.00)
LDL Cholesterol: 148 mg/dL — ABNORMAL HIGH (ref 0–99)
NonHDL: 164.53
TRIGLYCERIDES: 81 mg/dL (ref 0.0–149.0)
VLDL: 16.2 mg/dL (ref 0.0–40.0)

## 2015-03-26 LAB — COMPREHENSIVE METABOLIC PANEL
ALT: 57 U/L — AB (ref 0–53)
AST: 30 U/L (ref 0–37)
Albumin: 4.7 g/dL (ref 3.5–5.2)
Alkaline Phosphatase: 70 U/L (ref 39–117)
BILIRUBIN TOTAL: 0.4 mg/dL (ref 0.2–1.2)
BUN: 10 mg/dL (ref 6–23)
CALCIUM: 9.6 mg/dL (ref 8.4–10.5)
CO2: 27 meq/L (ref 19–32)
CREATININE: 0.92 mg/dL (ref 0.40–1.50)
Chloride: 105 mEq/L (ref 96–112)
GFR: 98.35 mL/min (ref >60.00)
Glucose, Bld: 120 mg/dL — ABNORMAL HIGH (ref 70–99)
Potassium: 4.2 mEq/L (ref 3.5–5.1)
Sodium: 139 mEq/L (ref 135–145)
Total Protein: 7.8 g/dL (ref 6.0–8.3)

## 2015-03-26 LAB — TSH: TSH: 0.93 u[IU]/mL (ref 0.35–4.50)

## 2015-03-30 ENCOUNTER — Encounter: Payer: Self-pay | Admitting: Family Medicine

## 2015-03-30 ENCOUNTER — Ambulatory Visit (INDEPENDENT_AMBULATORY_CARE_PROVIDER_SITE_OTHER): Payer: BLUE CROSS/BLUE SHIELD | Admitting: Family Medicine

## 2015-03-30 VITALS — BP 136/96 | HR 64 | Temp 98.4°F | Wt 246.5 lb

## 2015-03-30 DIAGNOSIS — R030 Elevated blood-pressure reading, without diagnosis of hypertension: Secondary | ICD-10-CM

## 2015-03-30 DIAGNOSIS — E785 Hyperlipidemia, unspecified: Secondary | ICD-10-CM

## 2015-03-30 DIAGNOSIS — R7401 Elevation of levels of liver transaminase levels: Secondary | ICD-10-CM

## 2015-03-30 DIAGNOSIS — Z0000 Encounter for general adult medical examination without abnormal findings: Secondary | ICD-10-CM | POA: Diagnosis not present

## 2015-03-30 DIAGNOSIS — R740 Nonspecific elevation of levels of transaminase and lactic acid dehydrogenase [LDH]: Secondary | ICD-10-CM

## 2015-03-30 DIAGNOSIS — E669 Obesity, unspecified: Secondary | ICD-10-CM | POA: Diagnosis not present

## 2015-03-30 NOTE — Assessment & Plan Note (Signed)
Preventative protocols reviewed and updated unless pt declined. Discussed healthy diet and lifestyle.  

## 2015-03-30 NOTE — Progress Notes (Signed)
Pre visit review using our clinic review tool, if applicable. No additional management support is needed unless otherwise documented below in the visit note. 

## 2015-03-30 NOTE — Assessment & Plan Note (Signed)
Very mild presumed fatty liver related. Consider [place].

## 2015-03-30 NOTE — Patient Instructions (Signed)
You are doing well today Continue healthy diet changes to continue controlling cholesterol and sugar. Return as needed or in 1 year for next physical.  Health Maintenance, Male A healthy lifestyle and preventative care can promote health and wellness.  Maintain regular health, dental, and eye exams.  Eat a healthy diet. Foods like vegetables, fruits, whole grains, low-fat dairy products, and lean protein foods contain the nutrients you need and are low in calories. Decrease your intake of foods high in solid fats, added sugars, and salt. Get information about a proper diet from your health care provider, if necessary.  Regular physical exercise is one of the most important things you can do for your health. Most adults should get at least 150 minutes of moderate-intensity exercise (any activity that increases your heart rate and causes you to sweat) each week. In addition, most adults need muscle-strengthening exercises on 2 or more days a week.   Maintain a healthy weight. The body mass index (BMI) is a screening tool to identify possible weight problems. It provides an estimate of body fat based on height and weight. Your health care provider can find your BMI and can help you achieve or maintain a healthy weight. For males 20 years and older:  A BMI below 18.5 is considered underweight.  A BMI of 18.5 to 24.9 is normal.  A BMI of 25 to 29.9 is considered overweight.  A BMI of 30 and above is considered obese.  Maintain normal blood lipids and cholesterol by exercising and minimizing your intake of saturated fat. Eat a balanced diet with plenty of fruits and vegetables. Blood tests for lipids and cholesterol should begin at age 49 and be repeated every 5 years. If your lipid or cholesterol levels are high, you are over age 27, or you are at high risk for heart disease, you may need your cholesterol levels checked more frequently.Ongoing high lipid and cholesterol levels should be treated  with medicines if diet and exercise are not working.  If you smoke, find out from your health care provider how to quit. If you do not use tobacco, do not start.  Lung cancer screening is recommended for adults aged 55-80 years who are at high risk for developing lung cancer because of a history of smoking. A yearly low-dose CT scan of the lungs is recommended for people who have at least a 30-pack-year history of smoking and are current smokers or have quit within the past 15 years. A pack year of smoking is smoking an average of 1 pack of cigarettes a day for 1 year (for example, a 30-pack-year history of smoking could mean smoking 1 pack a day for 30 years or 2 packs a day for 15 years). Yearly screening should continue until the smoker has stopped smoking for at least 15 years. Yearly screening should be stopped for people who develop a health problem that would prevent them from having lung cancer treatment.  If you choose to drink alcohol, do not have more than 2 drinks per day. One drink is considered to be 12 oz (360 mL) of beer, 5 oz (150 mL) of wine, or 1.5 oz (45 mL) of liquor.  Avoid the use of street drugs. Do not share needles with anyone. Ask for help if you need support or instructions about stopping the use of drugs.  High blood pressure causes heart disease and increases the risk of stroke. High blood pressure is more likely to develop in:  People who have  blood pressure in the end of the normal range (100-139/85-89 mm Hg).  People who are overweight or obese.  People who are African American.  If you are 19-69 years of age, have your blood pressure checked every 3-5 years. If you are 36 years of age or older, have your blood pressure checked every year. You should have your blood pressure measured twice--once when you are at a hospital or clinic, and once when you are not at a hospital or clinic. Record the average of the two measurements. To check your blood pressure when you  are not at a hospital or clinic, you can use:  An automated blood pressure machine at a pharmacy.  A home blood pressure monitor.  If you are 59-63 years old, ask your health care provider if you should take aspirin to prevent heart disease.  Diabetes screening involves taking a blood sample to check your fasting blood sugar level. This should be done once every 3 years after age 69 if you are at a normal weight and without risk factors for diabetes. Testing should be considered at a younger age or be carried out more frequently if you are overweight and have at least 1 risk factor for diabetes.  Colorectal cancer can be detected and often prevented. Most routine colorectal cancer screening begins at the age of 28 and continues through age 53. However, your health care provider may recommend screening at an earlier age if you have risk factors for colon cancer. On a yearly basis, your health care provider may provide home test kits to check for hidden blood in the stool. A small camera at the end of a tube may be used to directly examine the colon (sigmoidoscopy or colonoscopy) to detect the earliest forms of colorectal cancer. Talk to your health care provider about this at age 13 when routine screening begins. A direct exam of the colon should be repeated every 5-10 years through age 81, unless early forms of precancerous polyps or small growths are found.  People who are at an increased risk for hepatitis B should be screened for this virus. You are considered at high risk for hepatitis B if:  You were born in a country where hepatitis B occurs often. Talk with your health care provider about which countries are considered high risk.  Your parents were born in a high-risk country and you have not received a shot to protect against hepatitis B (hepatitis B vaccine).  You have HIV or AIDS.  You use needles to inject street drugs.  You live with, or have sex with, someone who has hepatitis  B.  You are a man who has sex with other men (MSM).  You get hemodialysis treatment.  You take certain medicines for conditions like cancer, organ transplantation, and autoimmune conditions.  Hepatitis C blood testing is recommended for all people born from [birth year] through 1967 and any individual with known risk factors for hepatitis C.  Healthy men should no longer receive prostate-specific antigen (PSA) blood tests as part of routine cancer screening. Talk to your health care provider about prostate cancer screening.  Testicular cancer screening is not recommended for adolescents or adult males who have no symptoms. Screening includes self-exam, a health care provider exam, and other screening tests. Consult with your health care provider about any symptoms you have or any concerns you have about testicular cancer.  Practice safe sex. Use condoms and avoid high-risk sexual practices to reduce the spread of sexually transmitted infections (  STIs).  You should be screened for STIs, including gonorrhea and chlamydia if:  You are sexually active and are younger than 24 years.  You are older than 24 years, and your health care provider tells you that you are at risk for this type of infection.  Your sexual activity has changed since you were last screened, and you are at an increased risk for chlamydia or gonorrhea. Ask your health care provider if you are at risk.  If you are at risk of being infected with HIV, it is recommended that you take a prescription medicine daily to prevent HIV infection. This is called pre-exposure prophylaxis (PrEP). You are considered at risk if:  You are a man who has sex with other men (MSM).  You are a heterosexual man who is sexually active with multiple partners.  You take drugs by injection.  You are sexually active with a partner who has HIV.  Talk with your health care provider about whether you are at high risk of being infected with HIV. If you  choose to begin PrEP, you should first be tested for HIV. You should then be tested every 3 months for as long as you are taking PrEP.  Use sunscreen. Apply sunscreen liberally and repeatedly throughout the day. You should seek shade when your shadow is shorter than you. Protect yourself by wearing long sleeves, pants, a wide-brimmed hat, and sunglasses year round whenever you are outdoors.  Tell your health care provider of new moles or changes in moles, especially if there is a change in shape or color. Also, tell your health care provider if a mole is larger than the size of a pencil eraser.  A one-time screening for abdominal aortic aneurysm (AAA) and surgical repair of large AAAs by ultrasound is recommended for men aged 21-75 years who are current or former smokers.  Stay current with your vaccines (immunizations).   This information is not intended to replace advice given to you by your health care provider. Make sure you discuss any questions you have with your health care provider.   Document Released: 07/16/2007 Document Revised: 02/07/2014 Document Reviewed: 06/14/2010 Elsevier Interactive Patient Education [employer].

## 2015-03-30 NOTE — Assessment & Plan Note (Signed)
Discussed healthy diet and lifestyle changes to affect sustainable weight loss  

## 2015-03-30 NOTE — Progress Notes (Signed)
BP 136/96 mmHg  Pulse 64  Temp(Src) 98.4 F (36.9 C) (Oral)  Wt 246 lb 8 oz (111.812 kg)   CC: CPE  Subjective:    Patient ID: Derrick Santiago, male    DOB: 1980/08/28, 35 y.o.   MRN: [id number]  HPI: Derrick Santiago is a 35 y.o. male presenting on 03/30/2015 for Annual Exam   Mom in and out of hospital over last several months, in [place].  Has traveled to [place] and [place].  Started seeing chiropractor Dr Azzie Glatter. Told had cervical DJD.   Healthy diet changes - cut out sugar. Working on lower carb diet. Drinking coffee, unsweet tea, water. Gained 8lbs in last few weeks (increased beer drinking). Stays active with children. Picking up golf.   Preventative: Tdap: 10/2010 Flu - declines Seat belt use discussed.  Sunscreen use discussed. No changing moles on skin.   Caffeine: down to 2 cups/day Lives with wife, 2 children (2007, 2010), 1 dog Occu: Control and instrumentation engineer Activity: walks, swims regularly Diet: fruits and vegetables daily, good water   Relevant past medical, surgical, family and social history reviewed and updated as indicated. Interim medical history since our last visit reviewed. Allergies and medications reviewed and updated. Current Outpatient Prescriptions on File Prior to Visit  Medication Sig  . aspirin-acetaminophen-caffeine (EXCEDRIN MIGRAINE) 250-250-65 MG per tablet Take 2 tablets by mouth as needed.    . hydrOXYzine (ATARAX/VISTARIL) 25 MG tablet Take 1 tablet (25 mg total) by mouth 2 (two) times daily as needed for anxiety (sedation precautions).  Boris Lown Oil 300 MG CAPS Take 1 capsule by mouth daily.  Marland Kitchen loratadine (CLARITIN) 10 MG tablet Take 10 mg by mouth daily.  . Multiple Vitamin (MULTIVITAMIN) tablet Take 1 tablet by mouth daily.  . SUMAtriptan (IMITREX) 50 MG tablet Take 1 tablet (50 mg total) by mouth daily as needed for migraine. May rpt 2nd dose in 2 hours if migraine not relieved   No current facility-administered medications on  file prior to visit.    Review of Systems  Constitutional: Negative for fever, chills, activity change, appetite change, fatigue and unexpected weight change.  HENT: Negative for hearing loss.   Eyes: Negative for visual disturbance.  Respiratory: Negative for cough, chest tightness, shortness of breath and wheezing.   Cardiovascular: Negative for chest pain, palpitations and leg swelling.  Gastrointestinal: Negative for nausea, vomiting, abdominal pain, diarrhea, constipation, blood in stool and abdominal distention.  Genitourinary: Negative for hematuria and difficulty urinating.  Musculoskeletal: Negative for myalgias, arthralgias and neck pain.  Skin: Negative for rash.  Neurological: Positive for headaches. Negative for dizziness, seizures and syncope.  Hematological: Negative for adenopathy. Does not bruise/bleed easily.  Psychiatric/Behavioral: Negative for dysphoric mood. The patient is not nervous/anxious.    Per HPI unless specifically indicated in ROS section     Objective:    BP 136/96 mmHg  Pulse 64  Temp(Src) 98.4 F (36.9 C) (Oral)  Wt 246 lb 8 oz (111.812 kg)  Wt Readings from Last 3 Encounters:  03/30/15 246 lb 8 oz (111.812 kg)  03/25/14 251 lb 8 oz (114.08 kg)  03/25/13 244 lb 12 oz (111.018 kg)   Body mass index is 31.64 kg/(m^2).  Physical Exam  Constitutional: He is oriented to person, place, and time. He appears well-developed and well-nourished. No distress.  HENT:  Head: Normocephalic and atraumatic.  Right Ear: Hearing, tympanic membrane, external ear and ear canal normal.  Left Ear: Hearing, tympanic membrane, external ear and ear canal  normal.  Nose: Nose normal.  Mouth/Throat: Uvula is midline, oropharynx is clear and moist and mucous membranes are normal. No oropharyngeal exudate, posterior oropharyngeal edema or posterior oropharyngeal erythema.  Eyes: Conjunctivae and EOM are normal. Pupils are equal, round, and reactive to light. No scleral  icterus.  Neck: Normal range of motion. Neck supple. No thyromegaly present.  Cardiovascular: Normal rate, regular rhythm, normal heart sounds and intact distal pulses.   No murmur heard. Pulses:      Radial pulses are 2+ on the right side, and 2+ on the left side.  Pulmonary/Chest: Effort normal and breath sounds normal. No respiratory distress. He has no wheezes. He has no rales.  Abdominal: Soft. Bowel sounds are normal. He exhibits no distension and no mass. There is no tenderness. There is no rebound and no guarding.  Musculoskeletal: Normal range of motion. He exhibits no edema.  Lymphadenopathy:    He has no cervical adenopathy.  Neurological: He is alert and oriented to person, place, and time.  CN grossly intact, station and gait intact  Skin: Skin is warm and dry. No rash noted.  Psychiatric: He has a normal mood and affect. His behavior is normal. Judgment and thought content normal.  Nursing note and vitals reviewed.  Results for orders placed or performed in visit on 03/26/15  Lipid panel  Result Value Ref Range   Cholesterol 209 (H) 0 - 200 mg/dL   Triglycerides 16.1 0.0 - 149.0 mg/dL   HDL 09.60 >45.40 mg/dL   VLDL 98.1 0.0 - 19.1 mg/dL   LDL Cholesterol 478 (H) 0 - 99 mg/dL   Total CHOL/HDL Ratio 5    NonHDL 164.53   Comprehensive metabolic panel  Result Value Ref Range   Sodium 139 135 - 145 mEq/L   Potassium 4.2 3.5 - 5.1 mEq/L   Chloride 105 96 - 112 mEq/L   CO2 27 19 - 32 mEq/L   Glucose, Bld 120 (H) 70 - 99 mg/dL   BUN 10 6 - 23 mg/dL   Creatinine, Ser 2.95 0.40 - 1.50 mg/dL   Total Bilirubin 0.4 0.2 - 1.2 mg/dL   Alkaline Phosphatase 70 39 - 117 U/L   AST 30 0 - 37 U/L   ALT 57 (H) 0 - 53 U/L   Total Protein 7.8 6.0 - 8.3 g/dL   Albumin 4.7 3.5 - 5.2 g/dL   Calcium 9.6 8.4 - 62.1 mg/dL   GFR 30.86 >57.84 mL/min  TSH  Result Value Ref Range   TSH 0.93 0.35 - 4.50 uIU/mL      Assessment & Plan:   Problem List Items Addressed This Visit     Transaminitis    Very mild presumed fatty liver related. Consider [place].       Obesity, Class I, BMI 30-34.9    Discussed healthy diet and lifestyle changes to affect sustainable weight loss.      Healthcare maintenance - Primary    Preventative protocols reviewed and updated unless pt declined. Discussed healthy diet and lifestyle.       Elevated blood pressure reading without diagnosis of hypertension    Anticipate will be able to control this with weight loss and healthy diet.       Dyslipidemia    Reviewed healthy changes to improve cholesterol levels          Follow up plan: Return in about 1 year (around 03/29/2016), or as needed, for annual exam, prior fasting for blood work.

## 2015-03-30 NOTE — Assessment & Plan Note (Signed)
Reviewed healthy changes to improve cholesterol levels

## 2015-03-30 NOTE — Assessment & Plan Note (Signed)
Anticipate will be able to control this with weight loss and healthy diet.

## 2015-06-03 ENCOUNTER — Other Ambulatory Visit: Payer: Self-pay | Admitting: Family Medicine

## 2015-12-28 ENCOUNTER — Ambulatory Visit
Admission: EM | Admit: 2015-12-28 | Discharge: 2015-12-28 | Disposition: A | Discharge status: Home / Self Care | Payer: BLUE CROSS/BLUE SHIELD | Attending: Family Medicine | Admitting: Family Medicine

## 2015-12-28 DIAGNOSIS — M25511 Pain in right shoulder: Secondary | ICD-10-CM | POA: Diagnosis not present

## 2015-12-28 DIAGNOSIS — M62838 Other muscle spasm: Secondary | ICD-10-CM

## 2015-12-28 DIAGNOSIS — S46811A Strain of other muscles, fascia and tendons at shoulder and upper arm level, right arm, initial encounter: Secondary | ICD-10-CM

## 2015-12-28 MED ORDER — MELOXICAM 15 MG PO TABS: 15.0000 mg | ORAL_TABLET | Freq: Every day | ORAL | 0 refills | Status: DC

## 2015-12-28 MED ORDER — ORPHENADRINE CITRATE ER 100 MG PO TB12: 100.0000 mg | ORAL_TABLET | Freq: Two times a day (BID) | ORAL | 0 refills | Status: DC

## 2015-12-28 MED ORDER — TRAMADOL HCL 50 MG PO TABS: 50.0000 mg | ORAL_TABLET | Freq: Three times a day (TID) | ORAL | 0 refills | Status: DC | PRN

## 2015-12-28 NOTE — ED Triage Notes (Addendum)
Pt c/o right shoulder pain, he remembers walking his dog that weighs around 100lbs over a week ago, and lifting the christmas tree.Ibuprofen isnt helping, feels like a sharp stabbing in his shoulder blade. ROM is ok but he says its tight.

## 2015-12-28 NOTE — ED Provider Notes (Signed)
[facility]    CSN: [id number] Arrival date & time: 12/28/15  1108     History   Chief Complaint Chief Complaint  Patient presents with  . Shoulder Pain    right    HPI Derrick Santiago is a 35 y.o. male.   Patient reports walking his dog about 70 days ago. States that the lesion goes on his left hand left wrist the uses his right hand to stabilize and try to direct the dog. The dog is rather large and last week Sunday basically took off jerking his right arm or shoulder. Since then he's been off several days he's been using icy hot for resting doing other modality treatments and taking Motrin. Nose much improvement and then yesterday he was trying to unload Christmas stuff from his attic and he had increased pain in the right shoulder and neck. Pain got so bad last night that awoke at 3:00 the morning is difficult for him to go back to sleep. He describes the pain as a sharp stabbing pain and the pain goes from his right shoulder down his right arm as well. No previous injury to the neck to the shoulder he does have a chiropractor who he sees when he needs adjustments of his lower back. He's had appendectomy he monitors or maintains his blood pressure with diet and exercise. He does not smoke no known drug allergies he has a significant allergy to soy and soy products. Smothers had stroke hypertension coronary artery disease and has had a grandmother who had breast cancer.   The history is provided by the patient. No language interpreter was used.  Shoulder Pain  Location:  Shoulder Shoulder location:  R shoulder Injury: yes   Time since incident:  8 days Mechanism of injury comment:  Pulling mechanism Pain details:    Quality:  Cramping and aching   Radiates to:  R upper arm   Severity:  Moderate   Timing:  Intermittent   Progression:  Worsening Handedness:  Right-handed Dislocation: no   Foreign body present:  No foreign bodies Prior injury to area:   No Relieved by:  Nothing Ineffective treatments:  NSAIDs Associated symptoms: muscle weakness, neck pain and stiffness     Past Medical History:  Diagnosis Date  . Back pain   . History of chicken pox   . History of hepatitis A 1993  . History of high blood pressure    readings- no dx of HTN  . Left knee pain   . Marijuana smoker (HCC) 09/18/2010  . Migraines   . Panic attack    worse mid 2012  . Seasonal allergies     Patient Active Problem List   Diagnosis Date Noted  . Obesity, Class I, BMI 30-34.9 03/25/2014  . Dyslipidemia 03/17/2014  . Neck pain 03/25/2013  . Elevated blood pressure reading without diagnosis of hypertension 06/22/2012  . Transaminitis 06/22/2012  . Polyarthralgia 06/22/2012  . Urticaria 01/13/2012  . Healthcare maintenance 09/18/2010  . Family history of early CAD 09/18/2010  . Panic attack   . Seasonal allergies   . Migraines     Past Surgical History:  Procedure Laterality Date  . APPENDECTOMY  1993       Home Medications    Prior to Admission medications   Medication Sig Start Date End Date Taking? Authorizing Provider  aspirin-acetaminophen-caffeine (EXCEDRIN MIGRAINE) [phone] MG per tablet Take 2 tablets by mouth as needed.     Yes Historical Provider, MD  hydrOXYzine (ATARAX/VISTARIL) 25 MG tablet Take 1 tablet (25 mg total) by mouth 2 (two) times daily as needed for anxiety (sedation precautions). 06/22/12  Yes Eustaquio BoydenJavier Gutierrez, MD  ibuprofen (ADVIL,MOTRIN) 200 MG tablet Take 400 mg by mouth daily with breakfast.   Yes Historical Provider, MD  Boris LownKrill Oil 300 MG CAPS Take 1 capsule by mouth daily.   Yes Historical Provider, MD  loratadine (CLARITIN) 10 MG tablet Take 10 mg by mouth daily.   Yes Historical Provider, MD  Multiple Vitamin (MULTIVITAMIN) tablet Take 1 tablet by mouth daily.   Yes Historical Provider, MD  SUMAtriptan (IMITREX) 50 MG tablet TAKE 1 TABLET BY MOUTH DAILY AS NEEDED FOR MIGRAINE. MAY REPEAT IN 2 HRS IF NOT  RELIEVED. 06/04/15  Yes Eustaquio BoydenJavier Gutierrez, MD  meloxicam (MOBIC) 15 MG tablet Take 1 tablet (15 mg total) by mouth daily. 12/28/15   Hassan RowanEugene Amai Cappiello, MD  orphenadrine (NORFLEX) 100 MG tablet Take 1 tablet (100 mg total) by mouth 2 (two) times daily. 12/28/15   Hassan RowanEugene Oreatha Fabry, MD  traMADol (ULTRAM) 50 MG tablet Take 1 tablet (50 mg total) by mouth 3 (three) times daily as needed for moderate pain or severe pain. Recommended taking this possible just at night to help sleep 12/28/15   Hassan RowanEugene Taylia Berber, MD    Family History Family History  Problem Relation Age of Onset  . Coronary artery disease Mother 331    CAD/MI, smoker  . Stroke Mother 6033  . Hypertension Mother   . Osteoarthritis Mother   . Arthritis Maternal Grandmother   . Kidney disease Maternal Grandfather   . Cancer Other     great grandmother with lung and breast cancer  . Diabetes Neg Hx     Social History Social History  Substance Use Topics  . Smoking status: Never Smoker  . Smokeless tobacco: Never Used  . Alcohol use Yes     Comment: Occasionally-rare     Allergies   Other   Review of Systems Review of Systems  Musculoskeletal: Positive for neck pain and stiffness.  All other systems reviewed and are negative.    Physical Exam Triage Vital Signs ED Triage Vitals  Enc Vitals Group     BP 12/28/15 1121 (!) 169/93     Pulse Rate 12/28/15 1121 99     Resp 12/28/15 1121 18     Temp 12/28/15 1121 97.8 F (36.6 C)     Temp Source 12/28/15 1121 Oral     SpO2 12/28/15 1121 99 %     Weight 12/28/15 1122 235 lb (106.6 kg)     Height 12/28/15 1122 5\' 11"  (1.803 m)     Head Circumference --      Peak Flow --      Pain Score 12/28/15 1123 6     Pain Loc --      Pain Edu? --      Excl. in GC? --    No data found.   Updated Vital Signs BP (!) 169/93 (BP Location: Left Arm)   Pulse 99   Temp 97.8 F (36.6 C) (Oral)   Resp 18   Ht 5\' 11"  (1.803 m)   Wt 235 lb (106.6 kg)   SpO2 99%   BMI 32.78 kg/m   Visual  Acuity Right Eye Distance:   Left Eye Distance:   Bilateral Distance:    Right Eye Near:   Left Eye Near:    Bilateral Near:     Physical Exam  Constitutional: He  is oriented to person, place, and time. He appears well-developed and well-nourished.  HENT:  Head: Normocephalic and atraumatic.  Right Ear: External ear normal.  Left Ear: External ear normal.  Mouth/Throat: Oropharynx is clear and moist.  Eyes: EOM are normal. Pupils are equal, round, and reactive to light.  Neck: Normal range of motion. Neck supple.  Pulmonary/Chest: Effort normal.  Musculoskeletal: He exhibits tenderness.       Right shoulder: He exhibits decreased range of motion and tenderness. He exhibits no bony tenderness and no swelling.       Arms: Most the pain tenderness along the right trapezius muscle both the cervical and the shoulder belly is tender to palpation in his muscle spasms present  Neurological: He is alert and oriented to person, place, and time. No cranial nerve deficit. Coordination normal.  Skin: Skin is warm.  Psychiatric: His behavior is normal.  Vitals reviewed.    UC Treatments / Results  Labs (all labs ordered are listed, but only abnormal results are displayed) Labs Reviewed - No data to display  EKG  EKG Interpretation None       Radiology No results found.  Procedures Procedures (including critical care time)  Medications Ordered in UC Medications - No data to display   Initial Impression / Assessment and Plan / UC Course  I have reviewed the triage vital signs and the nursing notes.  Pertinent labs & imaging results that were available during my care of the patient were reviewed by me and considered in my medical decision making (see chart for details).  Clinical Course     I've recommended muscle relaxer such as Norflex and inflammatory cysts Mobic do not take Motrin or Naprosyn and will give him some tramadol 50 mg to take mostly at night up to 3 times a  day if needed. We discussed extensively the sound (muscle spasms for muscle strain from when he walked the dog. Since this is all soft tissue, forego x-raying him since is no direct trauma to the neck area and be unlikely to be any type of fracture of the neck or the shoulder area. Also recommended that he is not better next week to see the chiropractor Dr. Larinda Buttery that he seen before. His PCP in 2 weeks we'll give him a work note for today and tomorrow as well.   Final Clinical Impressions(s) / UC Diagnoses   Final diagnoses:  Acute pain of right shoulder  Trapezius muscle strain, right, initial encounter  Muscle spasms of neck    New Prescriptions New Prescriptions   MELOXICAM (MOBIC) 15 MG TABLET    Take 1 tablet (15 mg total) by mouth daily.   ORPHENADRINE (NORFLEX) 100 MG TABLET    Take 1 tablet (100 mg total) by mouth 2 (two) times daily.   TRAMADOL (ULTRAM) 50 MG TABLET    Take 1 tablet (50 mg total) by mouth 3 (three) times daily as needed for moderate pain or severe pain. Recommended taking this possible just at night to help sleep     Hassan Rowan, MD 12/28/15 1237

## 2016-02-26 DIAGNOSIS — M9901 Segmental and somatic dysfunction of cervical region: Secondary | ICD-10-CM | POA: Diagnosis not present

## 2016-02-26 DIAGNOSIS — M542 Cervicalgia: Secondary | ICD-10-CM | POA: Diagnosis not present

## 2016-03-28 ENCOUNTER — Other Ambulatory Visit: Payer: Self-pay | Admitting: Family Medicine

## 2016-03-28 DIAGNOSIS — R7401 Elevation of levels of liver transaminase levels: Secondary | ICD-10-CM

## 2016-03-28 DIAGNOSIS — R740 Nonspecific elevation of levels of transaminase and lactic acid dehydrogenase [LDH]: Principal | ICD-10-CM

## 2016-03-28 DIAGNOSIS — E785 Hyperlipidemia, unspecified: Secondary | ICD-10-CM

## 2016-03-29 ENCOUNTER — Other Ambulatory Visit (INDEPENDENT_AMBULATORY_CARE_PROVIDER_SITE_OTHER): Payer: BLUE CROSS/BLUE SHIELD

## 2016-03-29 DIAGNOSIS — R740 Nonspecific elevation of levels of transaminase and lactic acid dehydrogenase [LDH]: Secondary | ICD-10-CM

## 2016-03-29 DIAGNOSIS — R7401 Elevation of levels of liver transaminase levels: Secondary | ICD-10-CM

## 2016-03-29 DIAGNOSIS — E785 Hyperlipidemia, unspecified: Secondary | ICD-10-CM | POA: Diagnosis not present

## 2016-03-29 LAB — COMPREHENSIVE METABOLIC PANEL
ALT: 39 U/L (ref 0–53)
AST: 21 U/L (ref 0–37)
Albumin: 4.5 g/dL (ref 3.5–5.2)
Alkaline Phosphatase: 67 U/L (ref 39–117)
BUN: 8 mg/dL (ref 6–23)
CO2: 25 mEq/L (ref 19–32)
Calcium: 9.7 mg/dL (ref 8.4–10.5)
Chloride: 107 mEq/L (ref 96–112)
Creatinine, Ser: 0.88 mg/dL (ref 0.40–1.50)
GFR: 102.97 mL/min (ref >60.00)
GLUCOSE: 118 mg/dL — AB (ref 70–99)
POTASSIUM: 3.8 meq/L (ref 3.5–5.1)
Sodium: 140 mEq/L (ref 135–145)
TOTAL PROTEIN: 7.4 g/dL (ref 6.0–8.3)
Total Bilirubin: 0.5 mg/dL (ref 0.2–1.2)

## 2016-03-29 LAB — LIPID PANEL
CHOL/HDL RATIO: 5
Cholesterol: 207 mg/dL — ABNORMAL HIGH (ref 0–200)
HDL: 43.4 mg/dL (ref >39.00)
LDL CALC: 126 mg/dL — AB (ref 0–99)
NONHDL: 163.32
Triglycerides: 189 mg/dL — ABNORMAL HIGH (ref 0.0–149.0)
VLDL: 37.8 mg/dL (ref 0.0–40.0)

## 2016-03-31 ENCOUNTER — Encounter: Payer: Self-pay | Admitting: Family Medicine

## 2016-03-31 ENCOUNTER — Ambulatory Visit (INDEPENDENT_AMBULATORY_CARE_PROVIDER_SITE_OTHER): Payer: BLUE CROSS/BLUE SHIELD | Admitting: Family Medicine

## 2016-03-31 VITALS — BP 152/98 | HR 107 | Temp 98.6°F | Ht 71.75 in | Wt 242.8 lb

## 2016-03-31 DIAGNOSIS — I10 Essential (primary) hypertension: Secondary | ICD-10-CM

## 2016-03-31 DIAGNOSIS — F410 Panic disorder [episodic paroxysmal anxiety] without agoraphobia: Secondary | ICD-10-CM

## 2016-03-31 DIAGNOSIS — Z8249 Family history of ischemic heart disease and other diseases of the circulatory system: Secondary | ICD-10-CM | POA: Diagnosis not present

## 2016-03-31 DIAGNOSIS — Z0000 Encounter for general adult medical examination without abnormal findings: Secondary | ICD-10-CM

## 2016-03-31 DIAGNOSIS — E669 Obesity, unspecified: Secondary | ICD-10-CM | POA: Diagnosis not present

## 2016-03-31 DIAGNOSIS — E785 Hyperlipidemia, unspecified: Secondary | ICD-10-CM

## 2016-03-31 MED ORDER — AMLODIPINE BESYLATE 10 MG PO TABS: 10.0000 mg | ORAL_TABLET | Freq: Every day | ORAL | 6 refills | Status: DC

## 2016-03-31 NOTE — Assessment & Plan Note (Signed)
Discussed healthy diet and lifestyle changes to affect sustainable weight loss  

## 2016-03-31 NOTE — Assessment & Plan Note (Signed)
Stable off meds - improved with weight loss. Better managing work stress

## 2016-03-31 NOTE — Progress Notes (Signed)
BP (!) 152/98 (BP Location: Right Arm, Cuff Size: Large)   Pulse (!) 107   Temp 98.6 F (37 C) (Oral)   Ht 5' 11.75" (1.822 m)   Wt 242 lb 12 oz (110.1 kg)   SpO2 98%   BMI 33.15 kg/m    CC: CPE Subjective:    Patient ID: Derrick Santiago, male    DOB: Jun 14, 1980, 36 y.o.   MRN: [id number]  HPI: Derrick Santiago is a 36 y.o. male presenting on 03/31/2016 for Annual Exam   Here with son [name].  Stopped alcohol on weekends. Working on healthy diet changes - low carb diet.  Anxiety doing well.  Migraines - down to 2 headaches a month  Preventative: Tdap: 10/2010 Flu - declines Seat belt use discussed.  Sunscreen use discussed. No changing moles on skin.  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 Interim medical history since our last visit reviewed. Allergies and medications reviewed and updated.  Outpatient Medications Prior to Visit  Medication Sig Dispense Refill  . aspirin-acetaminophen-caffeine (EXCEDRIN MIGRAINE) 250-250-65 MG per tablet Take 2 tablets by mouth as needed.      Marland Kitchen ibuprofen (ADVIL,MOTRIN) 200 MG tablet Take 400 mg by mouth daily with breakfast.    . Krill Oil 300 MG CAPS Take 1 capsule by mouth daily.    . Multiple Vitamin (MULTIVITAMIN) tablet Take 1 tablet by mouth daily.    . SUMAtriptan (IMITREX) 50 MG tablet TAKE 1 TABLET BY MOUTH DAILY AS NEEDED FOR MIGRAINE. MAY REPEAT IN 2 HRS IF NOT RELIEVED. 10 tablet 2  . hydrOXYzine (ATARAX/VISTARIL) 25 MG tablet Take 1 tablet (25 mg total) by mouth 2 (two) times daily as needed for anxiety (sedation precautions). (Patient not taking: Reported on 03/31/2016) 30 tablet 3  . loratadine (CLARITIN) 10 MG tablet Take 10 mg by mouth  daily.    . meloxicam (MOBIC) 15 MG tablet Take 1 tablet (15 mg total) by mouth daily. 30 tablet 0  . orphenadrine (NORFLEX) 100 MG tablet Take 1 tablet (100 mg total) by mouth 2 (two) times daily. 60 tablet 0  . traMADol (ULTRAM) 50 MG tablet Take 1 tablet (50 mg total) by mouth 3 (three) times daily as needed for moderate pain or severe pain. Recommended taking this possible just at night to help sleep 15 tablet 0   No facility-administered medications prior to visit.      Per HPI unless specifically indicated in ROS section below Review of Systems  Constitutional: Negative for activity change, appetite change, chills, fatigue, fever and unexpected weight change.  HENT: Negative for hearing loss.   Eyes: Negative for visual disturbance.  Respiratory: Negative for cough, chest tightness, shortness of breath and wheezing.   Cardiovascular: Negative for chest pain, palpitations and leg swelling.  Gastrointestinal: Negative for abdominal distention, abdominal pain, blood in stool, constipation, diarrhea, nausea and vomiting.  Genitourinary: Negative for difficulty urinating and hematuria.  Musculoskeletal: Negative for arthralgias, myalgias and neck pain.  Skin: Negative for rash.  Neurological: Negative for dizziness, seizures, syncope and headaches.  Hematological: Negative for adenopathy. Does not bruise/bleed easily.  Psychiatric/Behavioral: Negative for dysphoric mood. The patient is not nervous/anxious.        Objective:  BP (!) 152/98 (BP Location: Right Arm, Cuff Size: Large)   Pulse (!) 107   Temp 98.6 F (37 C) (Oral)   Ht 5' 11.75" (1.822 m)   Wt 242 lb 12 oz (110.1 kg)   SpO2 98%   BMI 33.15 kg/m   Wt Readings from Last 3 Encounters:  03/31/16 242 lb 12 oz (110.1 kg)  12/28/15 235 lb (106.6 kg)  03/30/15 246 lb 8 oz (111.8 kg)    Physical Exam  Constitutional: He is oriented to person, place, and time. He appears well-developed and well-nourished. No distress.    HENT:  Head: Normocephalic and atraumatic.  Right Ear: Hearing, tympanic membrane, external ear and ear canal normal.  Left Ear: Hearing, tympanic membrane, external ear and ear canal normal.  Nose: Nose normal.  Mouth/Throat: Uvula is midline, oropharynx is clear and moist and mucous membranes are normal. No oropharyngeal exudate, posterior oropharyngeal edema or posterior oropharyngeal erythema.  Eyes: Conjunctivae and EOM are normal. Pupils are equal, round, and reactive to light. No scleral icterus.  Neck: Normal range of motion. Neck supple. No thyromegaly present.  Cardiovascular: Normal rate, regular rhythm, normal heart sounds and intact distal pulses.   No murmur heard. Pulses:      Radial pulses are 2+ on the right side, and 2+ on the left side.  Pulmonary/Chest: Effort normal and breath sounds normal. No respiratory distress. He has no wheezes. He has no rales.  Abdominal: Soft. Bowel sounds are normal. He exhibits no distension and no mass. There is no tenderness. There is no rebound and no guarding.  Musculoskeletal: Normal range of motion. He exhibits no edema.  Lymphadenopathy:    He has no cervical adenopathy.  Neurological: He is alert and oriented to person, place, and time.  CN grossly intact, station and gait intact  Skin: Skin is warm and dry. No rash noted.  Psychiatric: He has a normal mood and affect. His behavior is normal. Judgment and thought content normal.  Nursing note and vitals reviewed.  Results for orders placed or performed in visit on 03/29/16  Lipid panel  Result Value Ref Range   Cholesterol 207 (H) 0 - 200 mg/dL   Triglycerides 604.5 (H) 0.0 - 149.0 mg/dL   HDL 40.98 >11.91 mg/dL   VLDL 47.8 0.0 - 29.5 mg/dL   LDL Cholesterol 621 (H) 0 - 99 mg/dL   Total CHOL/HDL Ratio 5    NonHDL 163.32   Comprehensive metabolic panel  Result Value Ref Range   Sodium 140 135 - 145 mEq/L   Potassium 3.8 3.5 - 5.1 mEq/L   Chloride 107 96 - 112 mEq/L   CO2  25 19 - 32 mEq/L   Glucose, Bld 118 (H) 70 - 99 mg/dL   BUN 8 6 - 23 mg/dL   Creatinine, Ser 3.08 0.40 - 1.50 mg/dL   Total Bilirubin 0.5 0.2 - 1.2 mg/dL   Alkaline Phosphatase 67 39 - 117 U/L   AST 21 0 - 37 U/L   ALT 39 0 - 53 U/L   Total Protein 7.4 6.0 - 8.3 g/dL   Albumin 4.5 3.5 - 5.2 g/dL   Calcium 9.7 8.4 - 65.7 mg/dL   GFR 846.96 >29.52 mL/min      Assessment & Plan:   Problem List Items Addressed This Visit    Dyslipidemia    Reviewed healthy diet changes to improve cholesterol levels.  ASCVD 10 yr risk = <2%.  Fmhx premature CAD  Essential hypertension    Chronic, deteriorated. We have been monitoring elevated blood pressures over last few years, remain uncontrolled despite attempts at healthy diet and lifestyle changes.  Will start amlodipine 10mg  daily. Discussed side effects to monitor. Recommended start at 5mg  daily for first week then increase to 10mg  daily if tolerated well. Advised he start monitoring blood pressure and let me know if any low bp symptoms. Consider updated EKG next visit       Relevant Medications   amLODipine (NORVASC) 10 MG tablet   Family history of early CAD   Healthcare maintenance - Primary    Preventative protocols reviewed and updated unless pt declined. Discussed healthy diet and lifestyle.       Obesity, Class I, BMI 30-34.9    Discussed healthy diet and lifestyle changes to affect sustainable weight loss.      Panic attack    Stable off meds - improved with weight loss. Better managing work stress           Follow up plan: Return in about 6 weeks (around 05/12/2016) for follow up visit.  Eustaquio BoydenJavier Brianda Beitler, MD

## 2016-03-31 NOTE — Assessment & Plan Note (Addendum)
Reviewed healthy diet changes to improve cholesterol levels.  ASCVD 10 yr risk = <2%.  Fmhx premature CAD

## 2016-03-31 NOTE — Progress Notes (Signed)
Pre visit review using our clinic review tool, if applicable. No additional management support is needed unless otherwise documented below in the visit note. 

## 2016-03-31 NOTE — Patient Instructions (Addendum)
Blood pressure staying too high. Your goal blood pressure is <140/90. Work on low salt/sodium diet - goal <1.5gm (1,500mg ) per day. Eat a diet high in fruits/vegetables and whole grains.  Look into mediterranean and DASH diet. Goal activity is 174min/wk of moderate intensity exercise.  This can be split into 30 minute chunks.  If you are not at this level, you can start with smaller 10-15 min increments and slowly build up activity. Look at www.heart.org for more resources  Return in 1-2 months for follow up visit.  Health Maintenance, Male A healthy lifestyle and preventive care is important for your health and wellness. Ask your health care provider about what schedule of regular examinations is right for you. What should I know about weight and diet?  Eat a Healthy Diet  Eat plenty of vegetables, fruits, whole grains, low-fat dairy products, and lean protein.  Do not eat a lot of foods high in solid fats, added sugars, or salt. Maintain a Healthy Weight  Regular exercise can help you achieve or maintain a healthy weight. You should:  Do at least 150 minutes of exercise each week. The exercise should increase your heart rate and make you sweat (moderate-intensity exercise).  Do strength-training exercises at least twice a week. Watch Your Levels of Cholesterol and Blood Lipids  Have your blood tested for lipids and cholesterol every 5 years starting at 38 years of age. If you are at high risk for heart disease, you should start having your blood tested when you are 38 years old. You may need to have your cholesterol levels checked more often if:  Your lipid or cholesterol levels are high.  You are older than 38 years of age.  You are at high risk for heart disease. What should I know about cancer screening? Many types of cancers can be detected early and may often be prevented. Lung Cancer  You should be screened every year for lung cancer if:  You are a current smoker who has  smoked for at least 30 years.  You are a former smoker who has quit within the past 15 years.  Talk to your health care provider about your screening options, when you should start screening, and how often you should be screened. Colorectal Cancer  Routine colorectal cancer screening usually begins at 38 years of age and should be repeated every 5-10 years until you are 38 years old. You may need to be screened more often if early forms of precancerous polyps or small growths are found. Your health care provider may recommend screening at an earlier age if you have risk factors for colon cancer.  Your health care provider may recommend using home test kits to check for hidden blood in the stool.  A small camera at the end of a tube can be used to examine your colon (sigmoidoscopy or colonoscopy). This checks for the earliest forms of colorectal cancer. Prostate and Testicular Cancer  Depending on your age and overall health, your health care provider may do certain tests to screen for prostate and testicular cancer.  Talk to your health care provider about any symptoms or concerns you have about testicular or prostate cancer. Skin Cancer  Check your skin from head to toe regularly.  Tell your health care provider about any new moles or changes in moles, especially if:  There is a change in a mole's size, shape, or color.  You have a mole that is larger than a pencil eraser.  Always use  sunscreen. Apply sunscreen liberally and repeat throughout the day.  Protect yourself by wearing long sleeves, pants, a wide-brimmed hat, and sunglasses when outside. What should I know about heart disease, diabetes, and high blood pressure?  If you are 1318-38 years of age, have your blood pressure checked every 3-5 years. If you are 38 years of age or older, have your blood pressure checked every year. You should have your blood pressure measured twice-once when you are at a hospital or clinic, and once  when you are not at a hospital or clinic. Record the average of the two measurements. To check your blood pressure when you are not at a hospital or clinic, you can use:  An automated blood pressure machine at a pharmacy.  A home blood pressure monitor.  Talk to your health care provider about your target blood pressure.  If you are between 8745-10721 years old, ask your health care provider if you should take aspirin to prevent heart disease.  Have regular diabetes screenings by checking your fasting blood sugar level.  If you are at a normal weight and have a low risk for diabetes, have this test once every three years after the age of 38.  If you are overweight and have a high risk for diabetes, consider being tested at a younger age or more often.  A one-time screening for abdominal aortic aneurysm (AAA) by ultrasound is recommended for men aged 65-75 years who are current or former smokers. What should I know about preventing infection? Hepatitis B  If you have a higher risk for hepatitis B, you should be screened for this virus. Talk with your health care provider to find out if you are at risk for hepatitis B infection. Hepatitis C  Blood testing is recommended for:  Everyone born from [id number] through 1965.  Anyone with known risk factors for hepatitis C. Sexually Transmitted Diseases (STDs)  You should be screened each year for STDs including gonorrhea and chlamydia if:  You are sexually active and are younger than 38 years of age.  You are older than 38 years of age and your health care provider tells you that you are at risk for this type of infection.  Your sexual activity has changed since you were last screened and you are at an increased risk for chlamydia or gonorrhea. Ask your health care provider if you are at risk.  Talk with your health care provider about whether you are at high risk of being infected with HIV. Your health care provider may recommend a prescription  medicine to help prevent HIV infection. What else can I do?  Schedule regular health, dental, and eye exams.  Stay current with your vaccines (immunizations).  Do not use any tobacco products, such as cigarettes, chewing tobacco, and e-cigarettes. If you need help quitting, ask your health care provider.  Limit alcohol intake to no more than 2 drinks per day. One drink equals 12 ounces of beer, 5 ounces of wine, or 1 ounces of hard liquor.  Do not use street drugs.  Do not share needles.  Ask your health care provider for help if you need support or information about quitting drugs.  Tell your health care provider if you often feel depressed.  Tell your health care provider if you have ever been abused or do not feel safe at home. This information is not intended to replace advice given to you by your health care provider. Make sure you discuss any questions you have  with your health care provider. Document Released: 07/16/2007 Document Revised: 09/16/2015 Document Reviewed: 10/21/2014 Elsevier Interactive Patient Education  2017 [employer].

## 2016-03-31 NOTE — Assessment & Plan Note (Signed)
Preventative protocols reviewed and updated unless pt declined. Discussed healthy diet and lifestyle.  

## 2016-03-31 NOTE — Assessment & Plan Note (Signed)
Chronic, deteriorated. We have been monitoring elevated blood pressures over last few years, remain uncontrolled despite attempts at healthy diet and lifestyle changes.  Will start amlodipine 10mg  daily. Discussed side effects to monitor. Recommended start at 5mg  daily for first week then increase to 10mg  daily if tolerated well. Advised he start monitoring blood pressure and let me know if any low bp symptoms. Consider updated EKG next visit

## 2016-05-09 ENCOUNTER — Ambulatory Visit (INDEPENDENT_AMBULATORY_CARE_PROVIDER_SITE_OTHER): Payer: BLUE CROSS/BLUE SHIELD | Admitting: Family Medicine

## 2016-05-09 ENCOUNTER — Encounter: Payer: Self-pay | Admitting: Family Medicine

## 2016-05-09 VITALS — BP 140/92 | HR 68 | Temp 98.3°F | Wt 239.0 lb

## 2016-05-09 DIAGNOSIS — I10 Essential (primary) hypertension: Secondary | ICD-10-CM

## 2016-05-09 NOTE — Progress Notes (Signed)
Pre visit review using our clinic review tool, if applicable. No additional management support is needed unless otherwise documented below in the visit note. 

## 2016-05-09 NOTE — Progress Notes (Addendum)
BP (!) 140/92 (BP Location: Right Arm, Cuff Size: Large)   Pulse 68   Temp 98.3 F (36.8 C) (Oral)   Wt 239 lb (108.4 kg)   SpO2 97%   BMI 32.64 kg/m    CC: HTN f/u visit Subjective:    Patient ID: Derrick Santiago, male    DOB: 09-May-1980, 36 y.o.   MRN: [id number]  HPI: Derrick Santiago is a 36 y.o. male presenting on 05/09/2016 for Follow-up   HTN - Compliant with current antihypertensive regimen of amlodipine  daily (started last month). Does check blood pressures at home with wrist cuff: 120-140/80-90. No low blood pressure readings or symptoms of dizziness/syncope. Denies HA, vision changes, CP/tightness, SOB, leg swelling. Actually headaches have improved some.   Just returned from [place] trip with family.   Relevant past medical, surgical, family and social history reviewed and updated as indicated. Interim medical history since our last visit reviewed. Allergies and medications reviewed and updated. Outpatient Medications Prior to Visit  Medication Sig Dispense Refill  . amLODipine (NORVASC) 10 MG tablet Take 1 tablet (10 mg total) by mouth daily. 30 tablet 6  . aspirin-acetaminophen-caffeine (EXCEDRIN MIGRAINE) 250-250-65 MG per tablet Take 2 tablets by mouth as needed.      Marland Kitchen ibuprofen (ADVIL,MOTRIN) 200 MG tablet Take 400 mg by mouth as needed.     Boris Lown Oil 300 MG CAPS Take 1 capsule by mouth daily.    . Multiple Vitamin (MULTIVITAMIN) tablet Take 1 tablet by mouth daily.    . SUMAtriptan (IMITREX) 50 MG tablet TAKE 1 TABLET BY MOUTH DAILY AS NEEDED FOR MIGRAINE. MAY REPEAT IN 2 HRS IF NOT RELIEVED. 10 tablet 2  . hydrOXYzine (ATARAX/VISTARIL) 25 MG tablet Take 1 tablet (25 mg total) by mouth 2 (two) times daily as needed for anxiety (sedation precautions). (Patient not taking: Reported on 05/09/2016) 30 tablet 3  . loratadine (CLARITIN) 10 MG tablet Take 10 mg by mouth daily.     No facility-administered medications prior to visit.      Per HPI unless  specifically indicated in ROS section below Review of Systems     Objective:    BP (!) 140/92 (BP Location: Right Arm, Cuff Size: Large)   Pulse 68   Temp 98.3 F (36.8 C) (Oral)   Wt 239 lb (108.4 kg)   SpO2 97%   BMI 32.64 kg/m   Wt Readings from Last 3 Encounters:  05/09/16 239 lb (108.4 kg)  03/31/16 242 lb 12 oz (110.1 kg)  12/28/15 235 lb (106.6 kg)    Physical Exam  Constitutional: He appears well-developed and well-nourished. No distress.  HENT:  Mouth/Throat: Oropharynx is clear and moist. No oropharyngeal exudate.  Eyes: Conjunctivae are normal. Pupils are equal, round, and reactive to light.  Cardiovascular: Normal rate, regular rhythm, normal heart sounds and intact distal pulses.   No murmur heard. Pulmonary/Chest: Effort normal and breath sounds normal. No respiratory distress. He has no wheezes. He has no rales.  Musculoskeletal: He exhibits no edema.  Skin: Skin is warm and dry. No rash noted.  Psychiatric: He has a normal mood and affect.  Nursing note and vitals reviewed.  Results for orders placed or performed in visit on 03/29/16  Lipid panel  Result Value Ref Range   Cholesterol 207 (H) 0 - 200 mg/dL   Triglycerides 132.4 (H) 0.0 - 149.0 mg/dL   HDL 40.10 >27.25 mg/dL   VLDL 36.6 0.0 - 44.0 mg/dL   LDL  Cholesterol 126 (H) 0 - 99 mg/dL   Total CHOL/HDL Ratio 5    NonHDL 163.32   Comprehensive metabolic panel  Result Value Ref Range   Sodium 140 135 - 145 mEq/L   Potassium 3.8 3.5 - 5.1 mEq/L   Chloride 107 96 - 112 mEq/L   CO2 25 19 - 32 mEq/L   Glucose, Bld 118 (H) 70 - 99 mg/dL   BUN 8 6 - 23 mg/dL   Creatinine, Ser 4.09 0.40 - 1.50 mg/dL   Total Bilirubin 0.5 0.2 - 1.2 mg/dL   Alkaline Phosphatase 67 39 - 117 U/L   AST 21 0 - 37 U/L   ALT 39 0 - 53 U/L   Total Protein 7.4 6.0 - 8.3 g/dL   Albumin 4.5 3.5 - 5.2 g/dL   Calcium 9.7 8.4 - 81.1 mg/dL   GFR 914.78 >29.56 mL/min   EKG - sinus tachycardia rate 100, normal axis, intervals,  nonspecific ST changes with T inversions inferiorly unchanged from 2012, good R wave progression.     Assessment & Plan:   Problem List Items Addressed This Visit    Essential hypertension - Primary    Chronic, improved with starting amlodipine last month. Continue. Still slightly above goal. Update EKG today. RTC 5-6 mo f/u visit      Relevant Orders   EKG 12-Lead (Completed)       Follow up plan: Return in about 6 months (around 11/08/2016) for follow up visit.  Eustaquio Boyden, MD

## 2016-05-09 NOTE — Assessment & Plan Note (Addendum)
Chronic, improved with starting amlodipine last month. Continue. Still slightly above goal. Update EKG today. RTC 5-6 mo f/u visit

## 2016-05-09 NOTE — Patient Instructions (Addendum)
EKG today.  You are doing well today - continue amlodipine  daily. I've refilled medicine for 3 month supply at a time.  Return as needed or in 5-6 months for follow up visit  Mediterranean Diet A Mediterranean diet refers to food and lifestyle choices that are based on the traditions of countries located on the [employer] [place]. This way of eating has been shown to help prevent certain conditions and improve outcomes for people who have chronic diseases, like kidney disease and heart disease. What are tips for following this plan? Lifestyle   Cook and eat meals together with your family, when possible.  Drink enough fluid to keep your urine clear or pale yellow.  Be physically active every day. This includes:  Aerobic exercise like running or swimming.  Leisure activities like gardening, walking, or housework.  Get 7-8 hours of sleep each night.  If recommended by your health care provider, drink red wine in moderation. This means 1 glass a day for nonpregnant women and 2 glasses a day for men. A glass of wine equals 5 oz (150 mL). Reading food labels   Check the serving size of packaged foods. For foods such as rice and pasta, the serving size refers to the amount of cooked product, not dry.  Check the total fat in packaged foods. Avoid foods that have saturated fat or trans fats.  Check the ingredients list for added sugars, such as corn syrup. Shopping   At the grocery store, buy most of your food from the areas near the walls of the store. This includes:  Fresh fruits and vegetables (produce).  Grains, beans, nuts, and seeds. Some of these may be available in unpackaged forms or large amounts (in bulk).  Fresh seafood.  Poultry and eggs.  Low-fat dairy products.  Buy whole ingredients instead of prepackaged foods.  Buy fresh fruits and vegetables in-season from local farmers markets.  Buy frozen fruits and vegetables in resealable bags.  If you do not have  access to quality fresh seafood, buy precooked frozen shrimp or canned fish, such as tuna, salmon, or sardines.  Buy small amounts of raw or cooked vegetables, salads, or olives from the deli or salad bar at your store.  Stock your pantry so you always have certain foods on hand, such as olive oil, canned tuna, canned tomatoes, rice, pasta, and beans. Cooking   Cook foods with extra-virgin olive oil instead of using butter or other vegetable oils.  Have meat as a side dish, and have vegetables or grains as your main dish. This means having meat in small portions or adding small amounts of meat to foods like pasta or stew.  Use beans or vegetables instead of meat in common dishes like chili or lasagna.  Experiment with different cooking methods. Try roasting or broiling vegetables instead of steaming or sauteing them.  Add frozen vegetables to soups, stews, pasta, or rice.  Add nuts or seeds for added healthy fat at each meal. You can add these to yogurt, salads, or vegetable dishes.  Marinate fish or vegetables using olive oil, lemon juice, garlic, and fresh herbs. Meal planning   Plan to eat 1 vegetarian meal one day each week. Try to work up to 2 vegetarian meals, if possible.  Eat seafood 2 or more times a week.  Have healthy snacks readily available, such as:  Vegetable sticks with hummus.  Greek yogurt.  Fruit and nut trail mix.  Eat balanced meals throughout the week. This includes:  Fruit: 2-3 servings a day  Vegetables: 4-5 servings a day  Low-fat dairy: 2 servings a day  Fish, poultry, or lean meat: 1 serving a day  Beans and legumes: 2 or more servings a week  Nuts and seeds: 1-2 servings a day  Whole grains: 6-8 servings a day  Extra-virgin olive oil: 3-4 servings a day  Limit red meat and sweets to only a few servings a month What are my food choices?  Mediterranean diet  Recommended  Grains: Whole-grain pasta. Brown rice. Bulgar wheat. Polenta.  Couscous. Whole-wheat bread. Orpah Cobb.  Vegetables: Artichokes. Beets. Broccoli. Cabbage. Carrots. Eggplant. Green beans. Chard. Kale. Spinach. Onions. Leeks. Peas. Squash. Tomatoes. Peppers. Radishes.  Fruits: Apples. Apricots. Avocado. Berries. Bananas. Cherries. Dates. Figs. Grapes. Lemons. Melon. Oranges. Peaches. Plums. Pomegranate.  Meats and other protein foods: Beans. Almonds. Sunflower seeds. Pine nuts. Peanuts. Cod. Salmon. Scallops. Shrimp. Tuna. Tilapia. Clams. Oysters. Eggs.  Dairy: Low-fat milk. Cheese. Greek yogurt.  Beverages: Water. Red wine. Herbal tea.  Fats and oils: Extra virgin olive oil. Avocado oil. Grape seed oil.  Sweets and desserts: [place] yogurt with honey. Baked apples. Poached pears. Trail mix.  Seasoning and other foods: Basil. Cilantro. Coriander. Cumin. Mint. Parsley. Sage. Rosemary. Tarragon. Garlic. Oregano. Thyme. Pepper. Balsalmic vinegar. Tahini. Hummus. Tomato sauce. Olives. Mushrooms.  Limit these  Grains: Prepackaged pasta or rice dishes. Prepackaged cereal with added sugar.  Vegetables: Deep fried potatoes (french fries).  Fruits: Fruit canned in syrup.  Meats and other protein foods: Beef. Pork. Lamb. Poultry with skin. Hot dogs. Tomasa Blase.  Dairy: Ice cream. Sour cream. Whole milk.  Beverages: Juice. Sugar-sweetened soft drinks. Beer. Liquor and spirits.  Fats and oils: Butter. Canola oil. Vegetable oil. Beef fat (tallow). Lard.  Sweets and desserts: Cookies. Cakes. Pies. Candy.  Seasoning and other foods: Mayonnaise. Premade sauces and marinades.  The items listed may not be a complete list. Talk with your dietitian about what dietary choices are right for you. Summary  The Mediterranean diet includes both food and lifestyle choices.  Eat a variety of fresh fruits and vegetables, beans, nuts, seeds, and whole grains.  Limit the amount of red meat and sweets that you eat.  Talk with your health care provider about whether  it is safe for you to drink red wine in moderation. This means 1 glass a day for nonpregnant women and 2 glasses a day for men. A glass of wine equals 5 oz (150 mL). This information is not intended to replace advice given to you by your health care provider. Make sure you discuss any questions you have with your health care provider. Document Released: 09/10/2015 Document Revised: 10/13/2015 Document Reviewed: 09/10/2015 Elsevier Interactive Patient Education  2017 [employer].

## 2016-10-21 ENCOUNTER — Other Ambulatory Visit: Payer: Self-pay | Admitting: Family Medicine

## 2016-11-08 ENCOUNTER — Encounter: Payer: Self-pay | Admitting: Family Medicine

## 2016-11-08 ENCOUNTER — Ambulatory Visit (INDEPENDENT_AMBULATORY_CARE_PROVIDER_SITE_OTHER): Payer: BLUE CROSS/BLUE SHIELD | Admitting: Family Medicine

## 2016-11-08 VITALS — BP 150/84 | HR 106 | Temp 98.3°F | Wt 235.8 lb

## 2016-11-08 DIAGNOSIS — I10 Essential (primary) hypertension: Secondary | ICD-10-CM | POA: Diagnosis not present

## 2016-11-08 DIAGNOSIS — E669 Obesity, unspecified: Secondary | ICD-10-CM

## 2016-11-08 MED ORDER — AMLODIPINE BESYLATE 10 MG PO TABS: 10.0000 mg | ORAL_TABLET | Freq: Every day | ORAL | 1 refills | Status: DC

## 2016-11-08 NOTE — Assessment & Plan Note (Signed)
Congratulated on healthy diet and lifestyle changes for weight loss to date.

## 2016-11-08 NOTE — Patient Instructions (Signed)
Return as needed or after 03/31/2017 for physical. Continue current medicines.

## 2016-11-08 NOTE — Assessment & Plan Note (Signed)
Chronic, above goal in office however he has good control at home. Continue amlodipine  daily. Continue healthy diet and lifestyle changes with goal ongoing weight loss (7lb drop since physical).

## 2016-11-08 NOTE — Progress Notes (Signed)
BP (!) 150/84 (BP Location: Right Arm, Cuff Size: Normal)   Pulse (!) 106   Temp 98.3 F (36.8 C) (Oral)   Wt 235 lb 12 oz (106.9 kg)   SpO2 97%   BMI 32.20 kg/m   Home readings much better controlled  CC: 6 mo f/u visit Subjective:    Patient ID: Derrick Santiago, male    DOB: 12-19-1980, 36 y.o.   MRN: [id number]  HPI: Derrick Santiago is a 36 y.o. male presenting on 11/08/2016 for Hypertension (follow-up. Needs 90 day rx for amlodipine)   HTN - Compliant with current antihypertensive regimen of amlodipine  daily. Does check blood pressures at home with wrist cuff: at home good readings 120-130/70-80s, HR 80-90s. No low blood pressure readings or symptoms of dizziness/syncope. Denies HA, vision changes, CP/tightness, SOB, leg swelling.    Continues exercising, continues losing weight.   Relevant past medical, surgical, family and social history reviewed and updated as indicated. Interim medical history since our last visit reviewed. Allergies and medications reviewed and updated. Outpatient Medications Prior to Visit  Medication Sig Dispense Refill  . aspirin-acetaminophen-caffeine (EXCEDRIN MIGRAINE) 250-250-65 MG per tablet Take 2 tablets by mouth as needed.      . hydrOXYzine (ATARAX/VISTARIL) 25 MG tablet Take 1 tablet (25 mg total) by mouth 2 (two) times daily as needed for anxiety (sedation precautions). 30 tablet 3  . ibuprofen (ADVIL,MOTRIN) 200 MG tablet Take 400 mg by mouth as needed.     Boris Lown Oil 300 MG CAPS Take 1 capsule by mouth daily.    Marland Kitchen loratadine (CLARITIN) 10 MG tablet Take 10 mg by mouth daily.    . Multiple Vitamin (MULTIVITAMIN) tablet Take 1 tablet by mouth daily.    . SUMAtriptan (IMITREX) 50 MG tablet TAKE 1 TABLET BY MOUTH DAILY AS NEEDED FOR MIGRAINE. MAY REPEAT IN 2 HRS IF NOT RELIEVED. 10 tablet 2  . amLODipine (NORVASC) 10 MG tablet TAKE 1 TABLET (10 MG TOTAL) BY MOUTH DAILY. 30 tablet 0   No facility-administered medications prior to  visit.      Per HPI unless specifically indicated in ROS section below Review of Systems     Objective:    BP (!) 150/84 (BP Location: Right Arm, Cuff Size: Normal)   Pulse (!) 106   Temp 98.3 F (36.8 C) (Oral)   Wt 235 lb 12 oz (106.9 kg)   SpO2 97%   BMI 32.20 kg/m   Wt Readings from Last 3 Encounters:  11/08/16 235 lb 12 oz (106.9 kg)  05/09/16 239 lb (108.4 kg)  03/31/16 242 lb 12 oz (110.1 kg)    Physical Exam  Constitutional: He appears well-developed and well-nourished. No distress.  HENT:  Mouth/Throat: Oropharynx is clear and moist. No oropharyngeal exudate.  Cardiovascular: Normal rate, regular rhythm, normal heart sounds and intact distal pulses.   No murmur heard. Pulmonary/Chest: Effort normal and breath sounds normal. No respiratory distress. He has no wheezes. He has no rales.  Musculoskeletal: He exhibits no edema.  Skin: Skin is warm and dry. No rash noted.  Psychiatric: He has a normal mood and affect.  Nursing note and vitals reviewed.     Assessment & Plan:  Declines flu shot Problem List Items Addressed This Visit    Essential hypertension - Primary    Chronic, above goal in office however he has good control at home. Continue amlodipine  daily. Continue healthy diet and lifestyle changes with goal ongoing weight loss (7lb  drop since physical).       Relevant Medications   amLODipine (NORVASC) 10 MG tablet   Obesity, Class I, BMI 30-34.9    Congratulated on healthy diet and lifestyle changes for weight loss to date.           Follow up plan: Return in about 5 months (around 04/08/2017) for annual exam, prior fasting for blood work.  Derrick Boyden, MD

## 2016-12-21 DIAGNOSIS — M9901 Segmental and somatic dysfunction of cervical region: Secondary | ICD-10-CM | POA: Diagnosis not present

## 2016-12-21 DIAGNOSIS — M542 Cervicalgia: Secondary | ICD-10-CM | POA: Diagnosis not present

## 2017-04-02 ENCOUNTER — Other Ambulatory Visit: Payer: Self-pay | Admitting: Family Medicine

## 2017-04-02 DIAGNOSIS — R7401 Elevation of levels of liver transaminase levels: Secondary | ICD-10-CM

## 2017-04-02 DIAGNOSIS — I10 Essential (primary) hypertension: Secondary | ICD-10-CM

## 2017-04-02 DIAGNOSIS — R739 Hyperglycemia, unspecified: Secondary | ICD-10-CM

## 2017-04-02 DIAGNOSIS — R740 Nonspecific elevation of levels of transaminase and lactic acid dehydrogenase [LDH]: Secondary | ICD-10-CM

## 2017-04-02 DIAGNOSIS — E785 Hyperlipidemia, unspecified: Secondary | ICD-10-CM

## 2017-04-03 ENCOUNTER — Other Ambulatory Visit (INDEPENDENT_AMBULATORY_CARE_PROVIDER_SITE_OTHER): Payer: BLUE CROSS/BLUE SHIELD

## 2017-04-03 DIAGNOSIS — I10 Essential (primary) hypertension: Secondary | ICD-10-CM

## 2017-04-03 DIAGNOSIS — R7401 Elevation of levels of liver transaminase levels: Secondary | ICD-10-CM

## 2017-04-03 DIAGNOSIS — R739 Hyperglycemia, unspecified: Secondary | ICD-10-CM | POA: Diagnosis not present

## 2017-04-03 DIAGNOSIS — R740 Nonspecific elevation of levels of transaminase and lactic acid dehydrogenase [LDH]: Secondary | ICD-10-CM

## 2017-04-03 DIAGNOSIS — E785 Hyperlipidemia, unspecified: Secondary | ICD-10-CM

## 2017-04-03 LAB — COMPREHENSIVE METABOLIC PANEL
ALK PHOS: 74 U/L (ref 39–117)
ALT: 34 U/L (ref 0–53)
AST: 17 U/L (ref 0–37)
Albumin: 4.2 g/dL (ref 3.5–5.2)
BUN: 8 mg/dL (ref 6–23)
CHLORIDE: 103 meq/L (ref 96–112)
CO2: 27 mEq/L (ref 19–32)
Calcium: 9.7 mg/dL (ref 8.4–10.5)
Creatinine, Ser: 0.84 mg/dL (ref 0.40–1.50)
GFR: 108.07 mL/min (ref >60.00)
GLUCOSE: 110 mg/dL — AB (ref 70–99)
POTASSIUM: 4 meq/L (ref 3.5–5.1)
Sodium: 139 mEq/L (ref 135–145)
TOTAL PROTEIN: 7.3 g/dL (ref 6.0–8.3)
Total Bilirubin: 0.4 mg/dL (ref 0.2–1.2)

## 2017-04-03 LAB — LIPID PANEL
Cholesterol: 190 mg/dL (ref 0–200)
HDL: 49.7 mg/dL (ref >39.00)
LDL CALC: 119 mg/dL — AB (ref 0–99)
NONHDL: 140.64
Total CHOL/HDL Ratio: 4
Triglycerides: 109 mg/dL (ref 0.0–149.0)
VLDL: 21.8 mg/dL (ref 0.0–40.0)

## 2017-04-03 LAB — HEMOGLOBIN A1C: HEMOGLOBIN A1C: 5.4 % (ref 4.6–6.5)

## 2017-04-07 ENCOUNTER — Encounter: Payer: Self-pay | Admitting: Family Medicine

## 2017-04-07 ENCOUNTER — Ambulatory Visit (INDEPENDENT_AMBULATORY_CARE_PROVIDER_SITE_OTHER): Payer: BLUE CROSS/BLUE SHIELD | Admitting: Family Medicine

## 2017-04-07 VITALS — BP 140/86 | HR 108 | Temp 97.9°F | Ht 72.0 in | Wt 235.0 lb

## 2017-04-07 DIAGNOSIS — R740 Nonspecific elevation of levels of transaminase and lactic acid dehydrogenase [LDH]: Secondary | ICD-10-CM | POA: Diagnosis not present

## 2017-04-07 DIAGNOSIS — Z0000 Encounter for general adult medical examination without abnormal findings: Secondary | ICD-10-CM

## 2017-04-07 DIAGNOSIS — G43809 Other migraine, not intractable, without status migrainosus: Secondary | ICD-10-CM

## 2017-04-07 DIAGNOSIS — E785 Hyperlipidemia, unspecified: Secondary | ICD-10-CM | POA: Diagnosis not present

## 2017-04-07 DIAGNOSIS — I10 Essential (primary) hypertension: Secondary | ICD-10-CM

## 2017-04-07 DIAGNOSIS — R7401 Elevation of levels of liver transaminase levels: Secondary | ICD-10-CM

## 2017-04-07 DIAGNOSIS — E669 Obesity, unspecified: Secondary | ICD-10-CM | POA: Diagnosis not present

## 2017-04-07 MED ORDER — SUMATRIPTAN SUCCINATE 50 MG PO TABS: ORAL_TABLET | ORAL | 2 refills | Status: DC

## 2017-04-07 NOTE — Assessment & Plan Note (Signed)
Chronic, stable. Continue current regimen.  Advised continue to monitor at home and let me know if persistently elevated to consider additional medication.

## 2017-04-07 NOTE — Assessment & Plan Note (Signed)
Doing better with better blood pressure control.

## 2017-04-07 NOTE — Assessment & Plan Note (Signed)
Improved

## 2017-04-07 NOTE — Assessment & Plan Note (Signed)
Discussed healthy diet and lifestyle changes to affect sustainable weight loss  

## 2017-04-07 NOTE — Patient Instructions (Signed)
You are doing well today.  Continue aerobic exercise.  Cut down on caffeine and monitor heart rate.  Continue monitoring blood pressures - if averaging higher than 140/90, let me know to add second blood pressure medicine (that will help with heart rate as well).  Return as needed or in 1 year for next physical.   Health Maintenance, Male A healthy lifestyle and preventive care is important for your health and wellness. Ask your health care provider about what schedule of regular examinations is right for you. What should I know about weight and diet? Eat a Healthy Diet  Eat plenty of vegetables, fruits, whole grains, low-fat dairy products, and lean protein.  Do not eat a lot of foods high in solid fats, added sugars, or salt.  Maintain a Healthy Weight Regular exercise can help you achieve or maintain a healthy weight. You should:  Do at least 150 minutes of exercise each week. The exercise should increase your heart rate and make you sweat (moderate-intensity exercise).  Do strength-training exercises at least twice a week.  Watch Your Levels of Cholesterol and Blood Lipids  Have your blood tested for lipids and cholesterol every 5 years starting at 39 years of age. If you are at high risk for heart disease, you should start having your blood tested when you are 39 years old. You may need to have your cholesterol levels checked more often if: ? Your lipid or cholesterol levels are high. ? You are older than 39 years of age. ? You are at high risk for heart disease.  What should I know about cancer screening? Many types of cancers can be detected early and may often be prevented. Lung Cancer  You should be screened every year for lung cancer if: ? You are a current smoker who has smoked for at least 30 years. ? You are a former smoker who has quit within the past 15 years.  Talk to your health care provider about your screening options, when you should start screening, and how  often you should be screened.  Colorectal Cancer  Routine colorectal cancer screening usually begins at 39 years of age and should be repeated every 5-10 years until you are 39 years old. You may need to be screened more often if early forms of precancerous polyps or small growths are found. Your health care provider may recommend screening at an earlier age if you have risk factors for colon cancer.  Your health care provider may recommend using home test kits to check for hidden blood in the stool.  A small camera at the end of a tube can be used to examine your colon (sigmoidoscopy or colonoscopy). This checks for the earliest forms of colorectal cancer.  Prostate and Testicular Cancer  Depending on your age and overall health, your health care provider may do certain tests to screen for prostate and testicular cancer.  Talk to your health care provider about any symptoms or concerns you have about testicular or prostate cancer.  Skin Cancer  Check your skin from head to toe regularly.  Tell your health care provider about any new moles or changes in moles, especially if: ? There is a change in a mole's size, shape, or color. ? You have a mole that is larger than a pencil eraser.  Always use sunscreen. Apply sunscreen liberally and repeat throughout the day.  Protect yourself by wearing long sleeves, pants, a wide-brimmed hat, and sunglasses when outside.  What should I know  about heart disease, diabetes, and high blood pressure?  If you are 59-10 years of age, have your blood pressure checked every 3-5 years. If you are 67 years of age or older, have your blood pressure checked every year. You should have your blood pressure measured twice-once when you are at a hospital or clinic, and once when you are not at a hospital or clinic. Record the average of the two measurements. To check your blood pressure when you are not at a hospital or clinic, you can use: ? An automated blood  pressure machine at a pharmacy. ? A home blood pressure monitor.  Talk to your health care provider about your target blood pressure.  If you are between 73-23 years old, ask your health care provider if you should take aspirin to prevent heart disease.  Have regular diabetes screenings by checking your fasting blood sugar level. ? If you are at a normal weight and have a low risk for diabetes, have this test once every three years after the age of 67. ? If you are overweight and have a high risk for diabetes, consider being tested at a younger age or more often.  A one-time screening for abdominal aortic aneurysm (AAA) by ultrasound is recommended for men aged 39-75 years who are current or former smokers. What should I know about preventing infection? Hepatitis B If you have a higher risk for hepatitis B, you should be screened for this virus. Talk with your health care provider to find out if you are at risk for hepatitis B infection. Hepatitis C Blood testing is recommended for:  Everyone born from [birth year] through 1967.  Anyone with known risk factors for hepatitis C.  Sexually Transmitted Diseases (STDs)  You should be screened each year for STDs including gonorrhea and chlamydia if: ? You are sexually active and are younger than 39 years of age. ? You are older than 39 years of age and your health care provider tells you that you are at risk for this type of infection. ? Your sexual activity has changed since you were last screened and you are at an increased risk for chlamydia or gonorrhea. Ask your health care provider if you are at risk.  Talk with your health care provider about whether you are at high risk of being infected with HIV. Your health care provider may recommend a prescription medicine to help prevent HIV infection.  What else can I do?  Schedule regular health, dental, and eye exams.  Stay current with your vaccines (immunizations).  Do not use any tobacco  products, such as cigarettes, chewing tobacco, and e-cigarettes. If you need help quitting, ask your health care provider.  Limit alcohol intake to no more than 2 drinks per day. One drink equals 12 ounces of beer, 5 ounces of wine, or 1 ounces of hard liquor.  Do not use street drugs.  Do not share needles.  Ask your health care provider for help if you need support or information about quitting drugs.  Tell your health care provider if you often feel depressed.  Tell your health care provider if you have ever been abused or do not feel safe at home. This information is not intended to replace advice given to you by your health care provider. Make sure you discuss any questions you have with your health care provider. Document Released: 07/16/2007 Document Revised: 09/16/2015 Document Reviewed: 10/21/2014 Elsevier Interactive Patient Education  [employer].

## 2017-04-07 NOTE — Progress Notes (Signed)
BP 140/86 (BP Location: Left Arm, Patient Position: Sitting, Cuff Size: Large)   Pulse (!) 108   Temp 97.9 F (36.6 C) (Oral)   Ht 6' (1.829 m)   Wt 235 lb (106.6 kg)   SpO2 99%   BMI 31.87 kg/m    CC: CPE Subjective:    Patient ID: Derrick Santiago, male    DOB: 10-23-1980, 37 y.o.   MRN: [id number]  HPI: Derrick Santiago is a 37 y.o. male presenting on 04/07/2017 for Annual Exam   Family stress - brother is on heroin, mother with kidney disease. They live in [place].  Home BP 125-130/80-85.  Strong fmhx ADHD.   Preventative: Flu - declines Tdap: 10/2010 Seat belt use discussed.  Sunscreen use discussed. No changing moles on skin.  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 Interim medical history since our last visit reviewed. Allergies and medications reviewed and updated. Outpatient Medications Prior to Visit  Medication Sig Dispense Refill  . amLODipine (NORVASC) 10 MG tablet Take 1 tablet (10 mg total) by mouth daily. 90 tablet 1  . aspirin-acetaminophen-caffeine (EXCEDRIN MIGRAINE) 250-250-65 MG per tablet Take 2 tablets by mouth as needed.      . hydrOXYzine (ATARAX/VISTARIL) 25 MG tablet Take 1 tablet (25 mg total) by mouth 2 (two) times daily as needed for anxiety (sedation precautions). 30 tablet 3  . ibuprofen (ADVIL,MOTRIN) 200 MG tablet Take 400 mg by mouth as needed.     Derrick Santiago 300 MG CAPS Take 1 capsule by mouth daily.    Marland Kitchen loratadine (CLARITIN) 10 MG tablet Take 10 mg by mouth daily.    . Multiple Vitamin (MULTIVITAMIN) tablet Take 1 tablet by mouth daily.    . SUMAtriptan (IMITREX) 50 MG tablet TAKE 1 TABLET BY MOUTH DAILY AS NEEDED FOR MIGRAINE. MAY REPEAT  IN 2 HRS IF NOT RELIEVED. 10 tablet 2   No facility-administered medications prior to visit.      Per HPI unless specifically indicated in ROS section below Review of Systems  Constitutional: Negative for activity change, appetite change, chills, fatigue, fever and unexpected weight change.  HENT: Negative for hearing loss.   Eyes: Negative for visual disturbance.  Respiratory: Negative for cough, chest tightness, shortness of breath and wheezing.   Cardiovascular: Negative for chest pain, palpitations and leg swelling.  Gastrointestinal: Negative for abdominal distention, abdominal pain, blood in stool, constipation, diarrhea, nausea and vomiting.  Genitourinary: Negative for difficulty urinating and hematuria.  Musculoskeletal: Negative for arthralgias, myalgias and neck pain.  Skin: Negative for rash.  Neurological: Positive for headaches (migraine). Negative for dizziness, seizures and syncope.  Hematological: Negative for adenopathy. Does not bruise/bleed easily.  Psychiatric/Behavioral: Negative for dysphoric mood. The patient is not nervous/anxious.        Stressors       Objective:    BP 140/86 (BP Location: Left Arm, Patient Position: Sitting, Cuff Size: Large)   Pulse (!) 108   Temp 97.9 F (36.6 C) (Oral)   Ht 6' (1.829 m)   Wt 235 lb (106.6 kg)   SpO2 99%   BMI 31.87 kg/m   Wt Readings from Last 3 Encounters:  04/07/17 235 lb (106.6 kg)  11/08/16 235 lb 12 oz (106.9 kg)  05/09/16 239 lb (108.4 kg)    Physical Exam  Constitutional: He is oriented to person, place, and time. He appears well-developed and well-nourished. No distress.  HENT:  Head: Normocephalic and atraumatic.  Right Ear: Hearing, tympanic membrane, external ear and ear canal normal.  Left Ear: Hearing, tympanic membrane, external ear and ear canal normal.  Nose: Nose normal.  Mouth/Throat: Uvula is midline, oropharynx is clear and moist and mucous membranes are normal. No oropharyngeal exudate,  posterior oropharyngeal edema or posterior oropharyngeal erythema.  Eyes: Conjunctivae and EOM are normal. Pupils are equal, round, and reactive to light. No scleral icterus.  Neck: Normal range of motion. Neck supple.  Cardiovascular: Normal rate, regular rhythm, normal heart sounds and intact distal pulses.  No murmur heard. Pulses:      Radial pulses are 2+ on the right side, and 2+ on the left side.  Pulmonary/Chest: Effort normal and breath sounds normal. No respiratory distress. He has no wheezes. He has no rales.  Abdominal: Soft. Bowel sounds are normal. He exhibits no distension and no mass. There is no tenderness. There is no rebound and no guarding.  Musculoskeletal: Normal range of motion. He exhibits no edema.  Lymphadenopathy:    He has no cervical adenopathy.  Neurological: He is alert and oriented to person, place, and time.  CN grossly intact, station and gait intact  Skin: Skin is warm and dry. No rash noted.  Psychiatric: He has a normal mood and affect. His behavior is normal. Judgment and thought content normal.  Nursing note and vitals reviewed.  Results for orders placed or performed in visit on 04/03/17  Hemoglobin A1c  Result Value Ref Range   Hgb A1c MFr Bld 5.4 4.6 - 6.5 %  Comprehensive metabolic panel  Result Value Ref Range   Sodium 139 135 - 145 mEq/L   Potassium 4.0 3.5 - 5.1 mEq/L   Chloride 103 96 - 112 mEq/L   CO2 27 19 - 32 mEq/L   Glucose, Bld 110 (H) 70 - 99 mg/dL   BUN 8 6 - 23 mg/dL   Creatinine, Ser 1.61 0.40 - 1.50 mg/dL   Total Bilirubin 0.4 0.2 - 1.2 mg/dL   Alkaline Phosphatase 74 39 - 117 U/L   AST 17 0 - 37 U/L   ALT 34 0 - 53 U/L   Total Protein 7.3 6.0 - 8.3 g/dL   Albumin 4.2 3.5 - 5.2 g/dL   Calcium 9.7 8.4 - 09.6 mg/dL   GFR 045.40 >98.11 mL/min  Lipid panel  Result Value Ref Range   Cholesterol 190 0 - 200 mg/dL   Triglycerides 914.7 0.0 - 149.0 mg/dL   HDL 82.95 >62.13 mg/dL   VLDL 08.6 0.0 - 57.8 mg/dL   LDL  Cholesterol 469 (H) 0 - 99 mg/dL   Total CHOL/HDL Ratio 4    NonHDL 140.64       Assessment & Plan:   Problem List Items Addressed This Visit    Dyslipidemia    Improvement noted with healthy diet changes over the past year. Congratulated.       Essential hypertension    Chronic, stable. Continue current regimen.  Advised continue to monitor at home and let me know if persistently elevated to consider additional medication.       Healthcare maintenance - Primary    Preventative protocols reviewed and updated unless pt declined. Discussed healthy diet and lifestyle.  Migraines    Doing better with better blood pressure control.      Relevant Medications   SUMAtriptan (IMITREX) 50 MG tablet   Obesity, Class I, BMI 30-34.9    Discussed healthy diet and lifestyle changes to affect sustainable weight loss.       Transaminitis    Improved.           Meds ordered this encounter  Medications  . SUMAtriptan (IMITREX) 50 MG tablet    Sig: TAKE 1 TABLET BY MOUTH DAILY AS NEEDED FOR MIGRAINE. MAY REPEAT IN 2 HRS IF NOT RELIEVED.    Dispense:  10 tablet    Refill:  2   No orders of the defined types were placed in this encounter.   Follow up plan: Return in about 1 year (around 04/08/2018) for annual exam, prior fasting for blood work.  Eustaquio BoydenJavier Cally Nygard, MD

## 2017-04-07 NOTE — Assessment & Plan Note (Signed)
Preventative protocols reviewed and updated unless pt declined. Discussed healthy diet and lifestyle.  

## 2017-04-07 NOTE — Assessment & Plan Note (Signed)
Improvement noted with healthy diet changes over the past year. Congratulated.

## 2017-05-19 ENCOUNTER — Other Ambulatory Visit: Payer: Self-pay | Admitting: Family Medicine

## 2017-08-31 DIAGNOSIS — M542 Cervicalgia: Secondary | ICD-10-CM | POA: Diagnosis not present

## 2017-08-31 DIAGNOSIS — M9901 Segmental and somatic dysfunction of cervical region: Secondary | ICD-10-CM | POA: Diagnosis not present

## 2017-08-31 DIAGNOSIS — M62830 Muscle spasm of back: Secondary | ICD-10-CM | POA: Diagnosis not present

## 2018-02-09 ENCOUNTER — Other Ambulatory Visit: Payer: Self-pay | Admitting: Family Medicine

## 2018-04-05 ENCOUNTER — Other Ambulatory Visit: Payer: Self-pay | Admitting: Family Medicine

## 2018-04-05 DIAGNOSIS — I10 Essential (primary) hypertension: Secondary | ICD-10-CM

## 2018-04-05 DIAGNOSIS — R740 Nonspecific elevation of levels of transaminase and lactic acid dehydrogenase [LDH]: Secondary | ICD-10-CM

## 2018-04-05 DIAGNOSIS — R7401 Elevation of levels of liver transaminase levels: Secondary | ICD-10-CM

## 2018-04-05 DIAGNOSIS — E785 Hyperlipidemia, unspecified: Secondary | ICD-10-CM

## 2018-04-05 DIAGNOSIS — R739 Hyperglycemia, unspecified: Secondary | ICD-10-CM

## 2018-04-06 ENCOUNTER — Other Ambulatory Visit (INDEPENDENT_AMBULATORY_CARE_PROVIDER_SITE_OTHER): Payer: BLUE CROSS/BLUE SHIELD

## 2018-04-06 DIAGNOSIS — R739 Hyperglycemia, unspecified: Secondary | ICD-10-CM | POA: Diagnosis not present

## 2018-04-06 DIAGNOSIS — R7401 Elevation of levels of liver transaminase levels: Secondary | ICD-10-CM

## 2018-04-06 DIAGNOSIS — E785 Hyperlipidemia, unspecified: Secondary | ICD-10-CM

## 2018-04-06 DIAGNOSIS — R740 Nonspecific elevation of levels of transaminase and lactic acid dehydrogenase [LDH]: Secondary | ICD-10-CM | POA: Diagnosis not present

## 2018-04-06 DIAGNOSIS — I10 Essential (primary) hypertension: Secondary | ICD-10-CM

## 2018-04-06 LAB — LIPID PANEL
CHOLESTEROL: 211 mg/dL — AB (ref 0–200)
HDL: 47.2 mg/dL (ref >39.00)
LDL Cholesterol: 138 mg/dL — ABNORMAL HIGH (ref 0–99)
NonHDL: 163.48
Total CHOL/HDL Ratio: 4
Triglycerides: 126 mg/dL (ref 0.0–149.0)
VLDL: 25.2 mg/dL (ref 0.0–40.0)

## 2018-04-06 LAB — MICROALBUMIN / CREATININE URINE RATIO
CREATININE, U: 76.9 mg/dL
Microalb Creat Ratio: 0.9 mg/g (ref 0.0–30.0)
Microalb, Ur: <0.7 mg/dL (ref 0.0–1.9)

## 2018-04-06 LAB — COMPREHENSIVE METABOLIC PANEL
ALT: 52 U/L (ref 0–53)
AST: 23 U/L (ref 0–37)
Albumin: 4.6 g/dL (ref 3.5–5.2)
Alkaline Phosphatase: 68 U/L (ref 39–117)
BUN: 13 mg/dL (ref 6–23)
CALCIUM: 9.6 mg/dL (ref 8.4–10.5)
CO2: 26 mEq/L (ref 19–32)
Chloride: 105 mEq/L (ref 96–112)
Creatinine, Ser: 0.84 mg/dL (ref 0.40–1.50)
GFR: 101.15 mL/min (ref >60.00)
Glucose, Bld: 115 mg/dL — ABNORMAL HIGH (ref 70–99)
Potassium: 3.8 mEq/L (ref 3.5–5.1)
Sodium: 139 mEq/L (ref 135–145)
Total Bilirubin: 0.7 mg/dL (ref 0.2–1.2)
Total Protein: 7.4 g/dL (ref 6.0–8.3)

## 2018-04-06 LAB — HEMOGLOBIN A1C: Hgb A1c MFr Bld: 5.5 % (ref 4.6–6.5)

## 2018-04-08 NOTE — Progress Notes (Signed)
BP 140/86 (BP Location: Left Arm, Patient Position: Sitting, Cuff Size: Large)   Pulse (!) 102   Temp 98.7 F (37.1 C) (Oral)   Ht 6' (1.829 m)   Wt 250 lb 8 oz (113.6 kg)   SpO2 98%   BMI 33.97 kg/m  On repeat 160/78  CC: CPE Subjective:    Patient ID: Derrick Santiago, male    DOB: [birth year], 38 y.o.   MRN: [id number]  HPI: Derrick Santiago is a 38 y.o. male presenting on 04/09/2018 for Annual Exam   Recent work trip to [place] cancelled due to [employer].   15 lb weight gain noted. Working out Barista 4x/wk, walks dog 5d/wk. BP - home readings 130/80s.   1-2 migraines/month - controlled with imitrex.   Preventative: Flu - today Tdap: 10/2010 Seat belt use discussed.  Sunscreen use discussed. No changing moles on skin. 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 Interim medical history since our last visit reviewed. Allergies and medications reviewed and updated. Outpatient Medications Prior to Visit  Medication Sig Dispense Refill  . amLODipine (NORVASC) 10 MG tablet TAKE 1 TABLET BY MOUTH EVERY DAY 90 tablet 0  . aspirin-acetaminophen-caffeine (EXCEDRIN MIGRAINE) 250-250-65 MG per tablet Take 2 tablets by mouth as needed.      . hydrOXYzine (ATARAX/VISTARIL) 25 MG tablet Take 1 tablet (25 mg total) by mouth 2 (two) times daily as needed for anxiety (sedation precautions). 30 tablet 3  . ibuprofen (ADVIL,MOTRIN) 200 MG tablet Take 400 mg by mouth as needed.     Derrick Santiago Oil 300 MG CAPS Take 1 capsule by mouth daily.    Marland Kitchen loratadine (CLARITIN) 10 MG tablet Take 10 mg by mouth daily.    . Multiple  Vitamin (MULTIVITAMIN) tablet Take 1 tablet by mouth daily.    . SUMAtriptan (IMITREX) 50 MG tablet TAKE 1 TABLET BY MOUTH DAILY AS NEEDED FOR MIGRAINE. MAY REPEAT IN 2 HRS IF NOT RELIEVED. 10 tablet 2   No facility-administered medications prior to visit.      Per HPI unless specifically indicated in ROS section below Review of Systems  Constitutional: Negative for activity change, appetite change, chills, fatigue, fever and unexpected weight change.  HENT: Negative for hearing loss.   Eyes: Negative for visual disturbance.  Respiratory: Negative for cough, chest tightness, shortness of breath and wheezing.   Cardiovascular: Negative for chest pain, palpitations and leg swelling.  Gastrointestinal: Negative for abdominal distention, abdominal pain, blood in stool, constipation, diarrhea, nausea and vomiting.  Genitourinary: Negative for difficulty urinating and hematuria.  Musculoskeletal: Negative for arthralgias, myalgias and neck pain.  Skin: Negative for rash.  Neurological: Negative for dizziness, seizures, syncope and headaches.  Hematological: Negative for adenopathy. Does not bruise/bleed easily.  Psychiatric/Behavioral: Negative for dysphoric mood. The patient is not nervous/anxious.    Objective:    BP 140/86 (BP Location: Left Arm, Patient Position: Sitting, Cuff Size: Large)   Pulse (!) 102   Temp 98.7 F (37.1 C) (Oral)   Ht 6' (1.829 m)   Wt 250 lb 8 oz (113.6 kg)   SpO2 98%   BMI  33.97 kg/m   Wt Readings from Last 3 Encounters:  04/09/18 250 lb 8 oz (113.6 kg)  04/07/17 235 lb (106.6 kg)  11/08/16 235 lb 12 oz (106.9 kg)    Physical Exam Vitals signs and nursing note reviewed.  Constitutional:      General: He is not in acute distress.    Appearance: Normal appearance. He is well-developed. He is not ill-appearing.  HENT:     Head: Normocephalic and atraumatic.     Right Ear: Hearing, tympanic membrane, ear canal and external ear normal.     Left Ear:  Hearing, tympanic membrane, ear canal and external ear normal.     Nose: Nose normal.     Mouth/Throat:     Mouth: Mucous membranes are moist.     Pharynx: Oropharynx is clear. Uvula midline. No oropharyngeal exudate or posterior oropharyngeal erythema.  Eyes:     General: No scleral icterus.    Conjunctiva/sclera: Conjunctivae normal.     Pupils: Pupils are equal, round, and reactive to light.  Neck:     Musculoskeletal: Normal range of motion and neck supple.  Cardiovascular:     Rate and Rhythm: Normal rate and regular rhythm.     Pulses: Normal pulses.          Radial pulses are 2+ on the right side and 2+ on the left side.     Heart sounds: Normal heart sounds. No murmur.  Pulmonary:     Effort: Pulmonary effort is normal. No respiratory distress.     Breath sounds: Normal breath sounds. No wheezing, rhonchi or rales.  Abdominal:     General: Abdomen is flat. Bowel sounds are normal. There is no distension.     Palpations: Abdomen is soft. There is no mass.     Tenderness: There is no abdominal tenderness. There is no guarding or rebound.     Hernia: No hernia is present.  Musculoskeletal: Normal range of motion.  Lymphadenopathy:     Cervical: No cervical adenopathy.  Skin:    General: Skin is warm and dry.     Findings: No rash.  Neurological:     Mental Status: He is alert and oriented to person, place, and time.     Comments: CN grossly intact, station and gait intact  Psychiatric:        Mood and Affect: Mood normal.        Behavior: Behavior normal.        Thought Content: Thought content normal.        Judgment: Judgment normal.       Results for orders placed or performed in visit on 04/06/18  Microalbumin / creatinine urine ratio  Result Value Ref Range   Microalb, Ur <0.7 0.0 - 1.9 mg/dL   Creatinine,U 04.5 mg/dL   Microalb Creat Ratio 0.9 0.0 - 30.0 mg/g  Hemoglobin A1c  Result Value Ref Range   Hgb A1c MFr Bld 5.5 4.6 - 6.5 %  Comprehensive metabolic  panel  Result Value Ref Range   Sodium 139 135 - 145 mEq/L   Potassium 3.8 3.5 - 5.1 mEq/L   Chloride 105 96 - 112 mEq/L   CO2 26 19 - 32 mEq/L   Glucose, Bld 115 (H) 70 - 99 mg/dL   BUN 13 6 - 23 mg/dL   Creatinine, Ser 4.09 0.40 - 1.50 mg/dL   Total Bilirubin 0.7 0.2 - 1.2 mg/dL   Alkaline Phosphatase 68 39 - 117 U/L   AST  23 0 - 37 U/L   ALT 52 0 - 53 U/L   Total Protein 7.4 6.0 - 8.3 g/dL   Albumin 4.6 3.5 - 5.2 g/dL   Calcium 9.6 8.4 - 84.1 mg/dL   GFR 660.63 >01.60 mL/min  Lipid panel  Result Value Ref Range   Cholesterol 211 (H) 0 - 200 mg/dL   Triglycerides 109.3 0.0 - 149.0 mg/dL   HDL 23.55 >73.22 mg/dL   VLDL 02.5 0.0 - 42.7 mg/dL   LDL Cholesterol 062 (H) 0 - 99 mg/dL   Total CHOL/HDL Ratio 4    NonHDL 163.48    Lab Results  Component Value Date   TSH 0.93 03/26/2015    Assessment & Plan:   Problem List Items Addressed This Visit    Obesity, Class I, BMI 30-34.9    Weight gain noted with recent scheduled workout routine (boxing). Congratulated on sustainable lifestyle changes. Encouraged ongoing efforts at sustainable weight loss.      Migraines    Infrequent, controlled with imitrex PRN.       Relevant Medications   SUMAtriptan (IMITREX) 50 MG tablet   metoprolol succinate (TOPROL-XL) 50 MG 24 hr tablet   Healthcare maintenance - Primary    Preventative protocols reviewed and updated unless pt declined. Discussed healthy diet and lifestyle.       Essential hypertension    Chronic, deteriorated - will add Toprol XL 50mg  to regimen. RTC 3 mo f/u visit.       Relevant Medications   metoprolol succinate (TOPROL-XL) 50 MG 24 hr tablet   Dyslipidemia    Chronic, stable off medication. Encouraged diet changes to improve LDL.  The 10-year ASCVD risk score Denman George DC Montez Hageman., et al., 2013) is: 1.9%   Values used to calculate the score:     Age: 35 years     Sex: Male     Is Non-Hispanic African American: No     Diabetic: No     Tobacco smoker: No      Systolic Blood Pressure: 140 mmHg     Is BP treated: Yes     HDL Cholesterol: 47.2 mg/dL     Total Cholesterol: 211 mg/dL        Other Visit Diagnoses    Need for influenza vaccination       Relevant Orders   Flu Vaccine QUAD 36+ mos IM (Completed)       Meds ordered this encounter  Medications  . SUMAtriptan (IMITREX) 50 MG tablet    Sig: TAKE 1 TABLET BY MOUTH DAILY AS NEEDED FOR MIGRAINE. MAY REPEAT IN 2 HRS IF NOT RELIEVED.    Dispense:  10 tablet    Refill:  2  . metoprolol succinate (TOPROL-XL) 50 MG 24 hr tablet    Sig: Take 1 tablet (50 mg total) by mouth daily. Take with or immediately following a meal.    Dispense:  30 tablet    Refill:  6   Orders Placed This Encounter  Procedures  . Flu Vaccine QUAD 36+ mos IM    Follow up plan: Return in about 6 months (around 10/10/2018) for follow up visit.  Eustaquio Boyden, MD

## 2018-04-09 ENCOUNTER — Encounter: Payer: Self-pay | Admitting: Family Medicine

## 2018-04-09 ENCOUNTER — Ambulatory Visit (INDEPENDENT_AMBULATORY_CARE_PROVIDER_SITE_OTHER): Payer: BLUE CROSS/BLUE SHIELD | Admitting: Family Medicine

## 2018-04-09 VITALS — BP 140/86 | HR 102 | Temp 98.7°F | Ht 72.0 in | Wt 250.5 lb

## 2018-04-09 DIAGNOSIS — I10 Essential (primary) hypertension: Secondary | ICD-10-CM

## 2018-04-09 DIAGNOSIS — Z0000 Encounter for general adult medical examination without abnormal findings: Secondary | ICD-10-CM

## 2018-04-09 DIAGNOSIS — E669 Obesity, unspecified: Secondary | ICD-10-CM

## 2018-04-09 DIAGNOSIS — Z23 Encounter for immunization: Secondary | ICD-10-CM | POA: Diagnosis not present

## 2018-04-09 DIAGNOSIS — E785 Hyperlipidemia, unspecified: Secondary | ICD-10-CM

## 2018-04-09 DIAGNOSIS — G43809 Other migraine, not intractable, without status migrainosus: Secondary | ICD-10-CM

## 2018-04-09 MED ORDER — METOPROLOL SUCCINATE ER 50 MG PO TB24: 50.0000 mg | ORAL_TABLET | Freq: Every day | ORAL | 6 refills | Status: DC

## 2018-04-09 MED ORDER — SUMATRIPTAN SUCCINATE 50 MG PO TABS: ORAL_TABLET | ORAL | 2 refills | Status: DC

## 2018-04-09 NOTE — Assessment & Plan Note (Signed)
Preventative protocols reviewed and updated unless pt declined. Discussed healthy diet and lifestyle.  

## 2018-04-09 NOTE — Assessment & Plan Note (Signed)
Chronic, deteriorated - will add Toprol XL 50mg  to regimen. RTC 3 mo f/u visit.

## 2018-04-09 NOTE — Assessment & Plan Note (Signed)
Infrequent, controlled with imitrex PRN.

## 2018-04-09 NOTE — Assessment & Plan Note (Signed)
Chronic, stable off medication. Encouraged diet changes to improve LDL.  The 10-year ASCVD risk score Denman George DC Montez Hageman., et al., 2013) is: 1.9%   Values used to calculate the score:     Age: 38 years     Sex: Male     Is Non-Hispanic African American: No     Diabetic: No     Tobacco smoker: No     Systolic Blood Pressure: 140 mmHg     Is BP treated: Yes     HDL Cholesterol: 47.2 mg/dL     Total Cholesterol: 211 mg/dL

## 2018-04-09 NOTE — Patient Instructions (Addendum)
Flu shot today Schedule dentist appointment.  Blood pressures is staying elevated - add metoprolol XL 50mg  1 tablet daily. Price out and let me know if unaffordable.  Good to see you today, call us with questions.  Return in 3-4 months for blood pressure follow up.   Health Maintenance, Male A healthy lifestyle and preventive care is important for your health and wellness. Ask your health care provider about what schedule of regular examinations is right for you. What should I know about weight and diet? Eat a Healthy Diet  Eat plenty of vegetables, fruits, whole grains, low-fat dairy products, and lean protein.  Do not eat a lot of foods high in solid fats, added sugars, or salt.  Maintain a Healthy Weight Regular exercise can help you achieve or maintain a healthy weight. You should:  Do at least 150 minutes of exercise each week. The exercise should increase your heart rate and make you sweat (moderate-intensity exercise).  Do strength-training exercises at least twice a week. Watch Your Levels of Cholesterol and Blood Lipids  Have your blood tested for lipids and cholesterol every 5 years starting at 40 years of age. If you are at high risk for heart disease, you should start having your blood tested when you are 40 years old. You may need to have your cholesterol levels checked more often if: ? Your lipid or cholesterol levels are high. ? You are older than 40 years of age. ? You are at high risk for heart disease. What should I know about cancer screening? Many types of cancers can be detected early and may often be prevented. Lung Cancer  You should be screened every year for lung cancer if: ? You are a current smoker who has smoked for at least 30 years. ? You are a former smoker who has quit within the past 15 years.  Talk to your health care provider about your screening options, when you should start screening, and how often you should be screened. Colorectal  Cancer  Routine colorectal cancer screening usually begins at 40 years of age and should be repeated every 5-10 years until you are 40 years old. You may need to be screened more often if early forms of precancerous polyps or small growths are found. Your health care provider may recommend screening at an earlier age if you have risk factors for colon cancer.  Your health care provider may recommend using home test kits to check for hidden blood in the stool.  A small camera at the end of a tube can be used to examine your colon (sigmoidoscopy or colonoscopy). This checks for the earliest forms of colorectal cancer. Prostate and Testicular Cancer  Depending on your age and overall health, your health care provider may do certain tests to screen for prostate and testicular cancer.  Talk to your health care provider about any symptoms or concerns you have about testicular or prostate cancer. Skin Cancer  Check your skin from head to toe regularly.  Tell your health care provider about any new moles or changes in moles, especially if: ? There is a change in a mole's size, shape, or color. ? You have a mole that is larger than a pencil eraser.  Always use sunscreen. Apply sunscreen liberally and repeat throughout the day.  Protect yourself by wearing long sleeves, pants, a wide-brimmed hat, and sunglasses when outside. What should I know about heart disease, diabetes, and high blood pressure?  If you are 18-39 years of  age, have your blood pressure checked every 3-5 years. If you are 45 years of age or older, have your blood pressure checked every year. You should have your blood pressure measured twice-once when you are at a hospital or clinic, and once when you are not at a hospital or clinic. Record the average of the two measurements. To check your blood pressure when you are not at a hospital or clinic, you can use: ? An automated blood pressure machine at a pharmacy. ? A home blood  pressure monitor.  Talk to your health care provider about your target blood pressure.  If you are between 51-17 years old, ask your health care provider if you should take aspirin to prevent heart disease.  Have regular diabetes screenings by checking your fasting blood sugar level. ? If you are at a normal weight and have a low risk for diabetes, have this test once every three years after the age of 59. ? If you are overweight and have a high risk for diabetes, consider being tested at a younger age or more often.  A one-time screening for abdominal aortic aneurysm (AAA) by ultrasound is recommended for men aged 65-75 years who are current or former smokers. What should I know about preventing infection? Hepatitis B If you have a higher risk for hepatitis B, you should be screened for this virus. Talk with your health care provider to find out if you are at risk for hepatitis B infection. Hepatitis C Blood testing is recommended for:  Everyone born from [birth year] through 1967.  Anyone with known risk factors for hepatitis C. Sexually Transmitted Diseases (STDs)  You should be screened each year for STDs including gonorrhea and chlamydia if: ? You are sexually active and are younger than 40 years of age. ? You are older than 40 years of age and your health care provider tells you that you are at risk for this type of infection. ? Your sexual activity has changed since you were last screened and you are at an increased risk for chlamydia or gonorrhea. Ask your health care provider if you are at risk.  Talk with your health care provider about whether you are at high risk of being infected with HIV. Your health care provider may recommend a prescription medicine to help prevent HIV infection. What else can I do?  Schedule regular health, dental, and eye exams.  Stay current with your vaccines (immunizations).  Do not use any tobacco products, such as cigarettes, chewing tobacco, and  e-cigarettes. If you need help quitting, ask your health care provider.  Limit alcohol intake to no more than 2 drinks per day. One drink equals 12 ounces of beer, 5 ounces of wine, or 1 ounces of hard liquor.  Do not use street drugs.  Do not share needles.  Ask your health care provider for help if you need support or information about quitting drugs.  Tell your health care provider if you often feel depressed.  Tell your health care provider if you have ever been abused or do not feel safe at home. This information is not intended to replace advice given to you by your health care provider. Make sure you discuss any questions you have with your health care provider. Document Released: 07/16/2007 Document Revised: 09/16/2015 Document Reviewed: 10/21/2014 Elsevier Interactive Patient Education  2019 [employer].

## 2018-04-09 NOTE — Assessment & Plan Note (Signed)
Weight gain noted with recent scheduled workout routine (boxing). Congratulated on sustainable lifestyle changes. Encouraged ongoing efforts at sustainable weight loss.

## 2018-05-06 ENCOUNTER — Other Ambulatory Visit: Payer: Self-pay | Admitting: Family Medicine

## 2018-07-16 ENCOUNTER — Ambulatory Visit: Payer: BLUE CROSS/BLUE SHIELD | Admitting: Family Medicine

## 2019-01-16 ENCOUNTER — Other Ambulatory Visit: Payer: Self-pay | Admitting: Family Medicine

## 2019-07-21 ENCOUNTER — Other Ambulatory Visit: Payer: Self-pay | Admitting: Family Medicine

## 2019-08-19 ENCOUNTER — Other Ambulatory Visit: Payer: Self-pay | Admitting: Family Medicine

## 2019-08-20 NOTE — Telephone Encounter (Signed)
E-scribed refill.  Plz schedule cpe and lab visits.  

## 2019-09-02 NOTE — Telephone Encounter (Signed)
Called patient and left vm for him to call office to get set up for cpe and labs.

## 2019-10-18 ENCOUNTER — Other Ambulatory Visit: Payer: Self-pay | Admitting: Family Medicine

## 2019-10-24 ENCOUNTER — Other Ambulatory Visit: Payer: Self-pay | Admitting: Family Medicine

## 2019-10-24 DIAGNOSIS — R7401 Elevation of levels of liver transaminase levels: Secondary | ICD-10-CM

## 2019-10-24 DIAGNOSIS — R739 Hyperglycemia, unspecified: Secondary | ICD-10-CM

## 2019-10-24 DIAGNOSIS — I10 Essential (primary) hypertension: Secondary | ICD-10-CM

## 2019-10-24 DIAGNOSIS — E785 Hyperlipidemia, unspecified: Secondary | ICD-10-CM

## 2019-10-25 ENCOUNTER — Other Ambulatory Visit: Payer: Self-pay

## 2019-10-25 ENCOUNTER — Other Ambulatory Visit (INDEPENDENT_AMBULATORY_CARE_PROVIDER_SITE_OTHER): Payer: BC Managed Care – PPO

## 2019-10-25 DIAGNOSIS — E785 Hyperlipidemia, unspecified: Secondary | ICD-10-CM

## 2019-10-25 DIAGNOSIS — R739 Hyperglycemia, unspecified: Secondary | ICD-10-CM | POA: Diagnosis not present

## 2019-10-25 DIAGNOSIS — I10 Essential (primary) hypertension: Secondary | ICD-10-CM | POA: Diagnosis not present

## 2019-10-25 LAB — COMPREHENSIVE METABOLIC PANEL
ALT: 40 U/L (ref 0–53)
AST: 18 U/L (ref 0–37)
Albumin: 4.5 g/dL (ref 3.5–5.2)
Alkaline Phosphatase: 63 U/L (ref 39–117)
BUN: 11 mg/dL (ref 6–23)
CO2: 28 mEq/L (ref 19–32)
Calcium: 9.8 mg/dL (ref 8.4–10.5)
Chloride: 104 mEq/L (ref 96–112)
Creatinine, Ser: 0.9 mg/dL (ref 0.40–1.50)
GFR: 92.69 mL/min (ref >60.00)
Glucose, Bld: 108 mg/dL — ABNORMAL HIGH (ref 70–99)
Potassium: 4 mEq/L (ref 3.5–5.1)
Sodium: 140 mEq/L (ref 135–145)
Total Bilirubin: 0.7 mg/dL (ref 0.2–1.2)
Total Protein: 7 g/dL (ref 6.0–8.3)

## 2019-10-25 LAB — LIPID PANEL
Cholesterol: 215 mg/dL — ABNORMAL HIGH (ref 0–200)
HDL: 41.9 mg/dL (ref >39.00)
LDL Cholesterol: 152 mg/dL — ABNORMAL HIGH (ref 0–99)
NonHDL: 173.39
Total CHOL/HDL Ratio: 5
Triglycerides: 107 mg/dL (ref 0.0–149.0)
VLDL: 21.4 mg/dL (ref 0.0–40.0)

## 2019-10-25 LAB — HEMOGLOBIN A1C: Hgb A1c MFr Bld: 5.6 % (ref 4.6–6.5)

## 2019-10-25 LAB — TSH: TSH: 1.58 u[IU]/mL (ref 0.35–4.50)

## 2019-10-25 LAB — MICROALBUMIN / CREATININE URINE RATIO
Creatinine,U: 41.7 mg/dL
Microalb Creat Ratio: 1.7 mg/g (ref 0.0–30.0)
Microalb, Ur: <0.7 mg/dL (ref 0.0–1.9)

## 2019-10-30 ENCOUNTER — Ambulatory Visit (INDEPENDENT_AMBULATORY_CARE_PROVIDER_SITE_OTHER): Payer: BC Managed Care – PPO | Admitting: Family Medicine

## 2019-10-30 ENCOUNTER — Encounter: Payer: Self-pay | Admitting: Family Medicine

## 2019-10-30 ENCOUNTER — Other Ambulatory Visit: Payer: Self-pay

## 2019-10-30 VITALS — BP 140/88 | HR 103 | Temp 98.4°F | Ht 72.0 in | Wt 243.1 lb

## 2019-10-30 DIAGNOSIS — Z0000 Encounter for general adult medical examination without abnormal findings: Secondary | ICD-10-CM

## 2019-10-30 DIAGNOSIS — Z8271 Family history of polycystic kidney: Secondary | ICD-10-CM

## 2019-10-30 DIAGNOSIS — Z23 Encounter for immunization: Secondary | ICD-10-CM

## 2019-10-30 DIAGNOSIS — R000 Tachycardia, unspecified: Secondary | ICD-10-CM

## 2019-10-30 DIAGNOSIS — I10 Essential (primary) hypertension: Secondary | ICD-10-CM

## 2019-10-30 DIAGNOSIS — Z8249 Family history of ischemic heart disease and other diseases of the circulatory system: Secondary | ICD-10-CM

## 2019-10-30 DIAGNOSIS — G43809 Other migraine, not intractable, without status migrainosus: Secondary | ICD-10-CM

## 2019-10-30 DIAGNOSIS — E785 Hyperlipidemia, unspecified: Secondary | ICD-10-CM

## 2019-10-30 DIAGNOSIS — E669 Obesity, unspecified: Secondary | ICD-10-CM

## 2019-10-30 DIAGNOSIS — F410 Panic disorder [episodic paroxysmal anxiety] without agoraphobia: Secondary | ICD-10-CM | POA: Diagnosis not present

## 2019-10-30 MED ORDER — HYDROXYZINE HCL 25 MG PO TABS: 25.0000 mg | ORAL_TABLET | Freq: Two times a day (BID) | ORAL | 3 refills | Status: DC | PRN

## 2019-10-30 MED ORDER — METOPROLOL SUCCINATE ER 25 MG PO TB24
25.0000 mg | ORAL_TABLET | Freq: Every day | ORAL | 6 refills | Status: DC
Start: 2019-10-30 — End: 2020-04-20

## 2019-10-30 MED ORDER — SUMATRIPTAN SUCCINATE 50 MG PO TABS
ORAL_TABLET | ORAL | 2 refills | Status: DC
Start: 2019-10-30 — End: 2021-06-14

## 2019-10-30 MED ORDER — AMLODIPINE BESYLATE 10 MG PO TABS
10.0000 mg | ORAL_TABLET | Freq: Every day | ORAL | 3 refills | Status: DC
Start: 2019-10-30 — End: 2020-12-14

## 2019-10-30 NOTE — Assessment & Plan Note (Addendum)
Chronic, adequate but not optimal on amlodipine. Advised try toprol XL 25mg  daily.

## 2019-10-30 NOTE — Assessment & Plan Note (Signed)
Significant improvement with increased regular exercise routine. Continues imitrex PRN, down to 1-2 every 3 months.

## 2019-10-30 NOTE — Assessment & Plan Note (Signed)
CT abd/pelvis 2012 - kidneys normal Discussed possible renal US next year.

## 2019-10-30 NOTE — Assessment & Plan Note (Signed)
Mother with CAD age early 26s.  Will draw Lipoprotein a next blood work.

## 2019-10-30 NOTE — Progress Notes (Signed)
This visit was conducted in person.  BP 140/88 (BP Location: Left Arm, Patient Position: Sitting, Cuff Size: Large)   Pulse (!) 103   Temp 98.4 F (36.9 C) (Temporal)   Ht 6' (1.829 m)   Wt 243 lb 1 oz (110.3 kg)   SpO2 98%   BMI 32.97 kg/m   BP Readings from Last 3 Encounters:  10/30/19 140/88  04/09/18 140/86  04/07/17 140/86   Pulse Readings from Last 3 Encounters:  10/30/19 (!) 103  04/09/18 (!) 102  04/07/17 (!) 108   CC: CPE Subjective:    Patient ID: Derrick Santiago, male    DOB: 1980-08-27, 39 y.o.   MRN: [id number]  HPI: Derrick Santiago is a 39 y.o. male presenting on 10/30/2019 for Annual Exam   Mother passed away 08/2018. Uncle passed away as well from kidney disease. Coworker died from COVID this year.   Tracking pulse with smart watch.  Bought nordic track bike - 30-3550min sessions 3-4x/wk. Walking 1+ mile daily.  Poor diet this year.   Migraines have improved with increasing exercise - down to 1-2 every 3 months.   Tachycardia - HR stays elevated - attributes to background stressors. Caffeine - 1-2 cups coffee/day. Drinking 1/2 gallon water/day - goal is 3 quarts/day.   Preventative: Flu - today Tdap: 10/2010 COVID vaccine - Pfizer 04/2019, 05/2019 Seat belt use discussed.  Sunscreen use discussed. No changing moles on skin. Non smoker  Alcohol - cut down significantly to 1-2 drinks/day. 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 Interim medical history since our last visit reviewed. Allergies and medications reviewed and updated. Outpatient Medications Prior to Visit  Medication Sig Dispense  Refill  . aspirin-acetaminophen-caffeine (EXCEDRIN MIGRAINE) 250-250-65 MG per tablet Take 2 tablets by mouth as needed.      Marland Kitchen. ibuprofen (ADVIL,MOTRIN) 200 MG tablet Take 400 mg by mouth as needed.     Boris Lown. Krill Oil 300 MG CAPS Take 1 capsule by mouth daily.    Marland Kitchen. loratadine (CLARITIN) 10 MG tablet Take 10 mg by mouth daily.    . Multiple Vitamin (MULTIVITAMIN) tablet Take 1 tablet by mouth daily.    Marland Kitchen. amLODipine (NORVASC) 10 MG tablet TAKE 1 TABLET (10 MG TOTAL) BY MOUTH DAILY. NEEDS OFFICE VISIT 30 tablet 0  . hydrOXYzine (ATARAX/VISTARIL) 25 MG tablet Take 1 tablet (25 mg total) by mouth 2 (two) times daily as needed for anxiety (sedation precautions). 30 tablet 3  . SUMAtriptan (IMITREX) 50 MG tablet TAKE 1 TABLET BY MOUTH DAILY AS NEEDED FOR MIGRAINE. MAY REPEAT IN 2 HRS IF NOT RELIEVED. 10 tablet 2  . metoprolol succinate (TOPROL-XL) 50 MG 24 hr tablet Take 1 tablet (50 mg total) by mouth daily. Take with or immediately following a meal. (Patient not taking: Reported on 10/30/2019) 30 tablet 6   No facility-administered medications prior to visit.     Per HPI unless specifically indicated in ROS section below Review of Systems  Constitutional: Negative for activity change, appetite change, chills, fatigue, fever and unexpected weight change.  HENT: Negative for hearing loss.   Eyes: Negative for visual disturbance.  Respiratory: Negative for  cough, chest tightness, shortness of breath and wheezing.   Cardiovascular: Negative for chest pain, palpitations and leg swelling.  Gastrointestinal: Negative for abdominal distention, abdominal pain, blood in stool, constipation, diarrhea, nausea and vomiting.  Genitourinary: Negative for difficulty urinating and hematuria.  Musculoskeletal: Negative for arthralgias, myalgias and neck pain.  Skin: Negative for rash.  Neurological: Negative for dizziness, seizures, syncope and headaches.  Hematological: Negative for adenopathy. Does not bruise/bleed  easily.  Psychiatric/Behavioral: Negative for dysphoric mood. The patient is nervous/anxious (no recent panic attacks).    Objective:  BP 140/88 (BP Location: Left Arm, Patient Position: Sitting, Cuff Size: Large)   Pulse (!) 103   Temp 98.4 F (36.9 C) (Temporal)   Ht 6' (1.829 m)   Wt 243 lb 1 oz (110.3 kg)   SpO2 98%   BMI 32.97 kg/m   Wt Readings from Last 3 Encounters:  10/30/19 243 lb 1 oz (110.3 kg)  04/09/18 250 lb 8 oz (113.6 kg)  04/07/17 235 lb (106.6 kg)      Physical Exam Vitals and nursing note reviewed.  Constitutional:      General: He is not in acute distress.    Appearance: Normal appearance. He is well-developed. He is not ill-appearing.  HENT:     Head: Normocephalic and atraumatic.     Right Ear: Hearing, tympanic membrane, ear canal and external ear normal.     Left Ear: Hearing, tympanic membrane, ear canal and external ear normal.  Eyes:     General: No scleral icterus.    Extraocular Movements: Extraocular movements intact.     Conjunctiva/sclera: Conjunctivae normal.     Pupils: Pupils are equal, round, and reactive to light.  Neck:     Thyroid: No thyroid mass or thyromegaly.  Cardiovascular:     Rate and Rhythm: Regular rhythm. Tachycardia present.     Pulses: Normal pulses.          Radial pulses are 2+ on the right side and 2+ on the left side.     Heart sounds: Normal heart sounds. No murmur heard.   Pulmonary:     Effort: Pulmonary effort is normal. No respiratory distress.     Breath sounds: Normal breath sounds. No wheezing, rhonchi or rales.  Abdominal:     General: Abdomen is flat. Bowel sounds are normal. There is no distension.     Palpations: Abdomen is soft. There is no mass.     Tenderness: There is no abdominal tenderness. There is no guarding or rebound.     Hernia: No hernia is present.  Musculoskeletal:        General: Normal range of motion.     Cervical back: Normal range of motion and neck supple.     Right lower leg:  No edema.     Left lower leg: No edema.  Lymphadenopathy:     Cervical: No cervical adenopathy.  Skin:    General: Skin is warm and dry.     Findings: No rash.  Neurological:     General: No focal deficit present.     Mental Status: He is alert and oriented to person, place, and time.     Comments: CN grossly intact, station and gait intact  Psychiatric:        Mood and Affect: Mood normal.        Behavior: Behavior normal.        Thought Content: Thought content normal.        Judgment: Judgment  normal.       Results for orders placed or performed in visit on 10/25/19  TSH  Result Value Ref Range   TSH 1.58 0.35 - 4.50 uIU/mL  Microalbumin / creatinine urine ratio  Result Value Ref Range   Microalb, Ur <0.7 0.0 - 1.9 mg/dL   Creatinine,U 86.7 mg/dL   Microalb Creat Ratio 1.7 0.0 - 30.0 mg/g  Hemoglobin A1c  Result Value Ref Range   Hgb A1c MFr Bld 5.6 4.6 - 6.5 %  Comprehensive metabolic panel  Result Value Ref Range   Sodium 140 135 - 145 mEq/L   Potassium 4.0 3.5 - 5.1 mEq/L   Chloride 104 96 - 112 mEq/L   CO2 28 19 - 32 mEq/L   Glucose, Bld 108 (H) 70 - 99 mg/dL   BUN 11 6 - 23 mg/dL   Creatinine, Ser 6.72 0.40 - 1.50 mg/dL   Total Bilirubin 0.7 0.2 - 1.2 mg/dL   Alkaline Phosphatase 63 39 - 117 U/L   AST 18 0 - 37 U/L   ALT 40 0 - 53 U/L   Total Protein 7.0 6.0 - 8.3 g/dL   Albumin 4.5 3.5 - 5.2 g/dL   GFR 09.47 >09.62 mL/min   Calcium 9.8 8.4 - 10.5 mg/dL  Lipid panel  Result Value Ref Range   Cholesterol 215 (H) 0 - 200 mg/dL   Triglycerides 836.6 0 - 149 mg/dL   HDL 29.47 >65.46 mg/dL   VLDL 50.3 0.0 - 54.6 mg/dL   LDL Cholesterol 568 (H) 0 - 99 mg/dL   Total CHOL/HDL Ratio 5    NonHDL 173.39    Assessment & Plan:  This visit occurred during the SARS-CoV-2 public health emergency.  Safety protocols were in place, including screening questions prior to the visit, additional usage of staff PPE, and extensive cleaning of exam room while observing  appropriate contact time as indicated for disinfecting solutions.   Problem List Items Addressed This Visit    Tachycardia    Chronic, mild tachycardia. This does not limit activity. However with mildly elevated blood pressures I did recommend he try toprol XL 25mg  daily - last year did not start 50mg  dose due to concern over possible side effects.       Panic attack    Overall stable period. Will continue PRN hydroxyzine.       Relevant Medications   hydrOXYzine (ATARAX/VISTARIL) 25 MG tablet   Obesity, Class I, BMI 30-34.9    Congratulated on weight loss to date. Encouraged healthy diet and lifestyle changes to affect sustainable weight loss.  Motivated to continue regular bicycling.       Migraines    Significant improvement with increased regular exercise routine. Continues imitrex PRN, down to 1-2 every 3 months.       Relevant Medications   SUMAtriptan (IMITREX) 50 MG tablet   amLODipine (NORVASC) 10 MG tablet   metoprolol succinate (TOPROL-XL) 25 MG 24 hr tablet   Healthcare maintenance - Primary    Preventative protocols reviewed and updated unless pt declined. Discussed healthy diet and lifestyle.       Family history of early CAD    Mother with CAD age early 32s.  Will draw Lipoprotein a next blood work.       Relevant Orders   Lipoprotein A (LPA)   Family history of adult polycystic kidney disease    CT abd/pelvis 2012 - kidneys normal Discussed possible renal 34s next year.  Essential hypertension    Chronic, adequate but not optimal on amlodipine. Advised try toprol XL 25mg  daily.       Relevant Medications   amLODipine (NORVASC) 10 MG tablet   metoprolol succinate (TOPROL-XL) 25 MG 24 hr tablet   Dyslipidemia    Deteriorated, strong fmhx CAD. Discussed Lipoprotein a levels - asked to check on insurance coverage. Recheck levels in 6 months, add this test at that time.  The 10-year ASCVD risk score DC Denman George., et al., 2013) is: 2.6%   Values used  to calculate the score:     Age: 55 years     Sex: Male     Is Non-Hispanic African American: No     Diabetic: No     Tobacco smoker: No     Systolic Blood Pressure: 140 mmHg     Is BP treated: Yes     HDL Cholesterol: 41.9 mg/dL     Total Cholesterol: 215 mg/dL       Relevant Orders   Lipid panel   Lipoprotein A (LPA)    Other Visit Diagnoses    Need for influenza vaccination       Relevant Orders   Flu Vaccine QUAD 36+ mos IM (Completed)       Meds ordered this encounter  Medications  . hydrOXYzine (ATARAX/VISTARIL) 25 MG tablet    Sig: Take 1 tablet (25 mg total) by mouth 2 (two) times daily as needed for anxiety (sedation precautions).    Dispense:  30 tablet    Refill:  3  . SUMAtriptan (IMITREX) 50 MG tablet    Sig: TAKE 1 TABLET BY MOUTH DAILY AS NEEDED FOR MIGRAINE. MAY REPEAT IN 2 HRS IF NOT RELIEVED.    Dispense:  10 tablet    Refill:  2  . amLODipine (NORVASC) 10 MG tablet    Sig: Take 1 tablet (10 mg total) by mouth daily.    Dispense:  90 tablet    Refill:  3  . metoprolol succinate (TOPROL-XL) 25 MG 24 hr tablet    Sig: Take 1 tablet (25 mg total) by mouth daily. Take with or immediately following a meal.    Dispense:  30 tablet    Refill:  6   Orders Placed This Encounter  Procedures  . Flu Vaccine QUAD 36+ mos IM  . Lipid panel    Standing Status:   Future    Standing Expiration Date:   10/29/2020  . Lipoprotein A (LPA)    Standing Status:   Future    Standing Expiration Date:   10/29/2020    Patient instructions: Flu shot today I'd like you to call insurance to see if lipoprotein A blood test is covered given strong family history of heart disease (CPT code 10/31/2020).  I do recommend trying metoprolol XL 25mg  once daily - sent to pharmacy. Let me know if any trouble tolerating.  Return in 6 months for lab visit only to recheck cholesterol levels. Return in 1 year for next physical.   Follow up plan: Return in about 1 year (around 10/29/2020) for  annual exam, prior fasting for blood work.  , MD

## 2019-10-30 NOTE — Assessment & Plan Note (Signed)
Preventative protocols reviewed and updated unless pt declined. Discussed healthy diet and lifestyle.  

## 2019-10-30 NOTE — Assessment & Plan Note (Signed)
Overall stable period. Will continue PRN hydroxyzine.

## 2019-10-30 NOTE — Assessment & Plan Note (Addendum)
Deteriorated, strong fmhx CAD. Discussed Lipoprotein a levels - asked to check on insurance coverage. Recheck levels in 6 months, add this test at that time.  The 10-year ASCVD risk score Denman George DC Montez Hageman., et al., 2013) is: 2.6%   Values used to calculate the score:     Age: 39 years     Sex: Male     Is Non-Hispanic African American: No     Diabetic: No     Tobacco smoker: No     Systolic Blood Pressure: 140 mmHg     Is BP treated: Yes     HDL Cholesterol: 41.9 mg/dL     Total Cholesterol: 215 mg/dL

## 2019-10-30 NOTE — Patient Instructions (Addendum)
Flu shot today I'd like you to call insurance to see if lipoprotein A blood test is covered given strong family history of heart disease (CPT code 27253).  I do recommend trying metoprolol XL 25mg  once daily - sent to pharmacy. Let me know if any trouble tolerating.  Return in 6 months for lab visit only to recheck cholesterol levels. Return in 1 year for next physical.   Health Maintenance, Male Adopting a healthy lifestyle and getting preventive care are important in promoting health and wellness. Ask your health care provider about:  The right schedule for you to have regular tests and exams.  Things you can do on your own to prevent diseases and keep yourself healthy. What should I know about diet, weight, and exercise? Eat a healthy diet   Eat a diet that includes plenty of vegetables, fruits, low-fat dairy products, and lean protein.  Do not eat a lot of foods that are high in solid fats, added sugars, or sodium. Maintain a healthy weight Body mass index (BMI) is a measurement that can be used to identify possible weight problems. It estimates body fat based on height and weight. Your health care provider can help determine your BMI and help you achieve or maintain a healthy weight. Get regular exercise Get regular exercise. This is one of the most important things you can do for your health. Most adults should:  Exercise for at least 150 minutes each week. The exercise should increase your heart rate and make you sweat (moderate-intensity exercise).  Do strengthening exercises at least twice a week. This is in addition to the moderate-intensity exercise.  Spend less time sitting. Even light physical activity can be beneficial. Watch cholesterol and blood lipids Have your blood tested for lipids and cholesterol at 41 years of age, then have this test every 5 years. You may need to have your cholesterol levels checked more often if:  Your lipid or cholesterol levels are  high.  You are older than 41 years of age.  You are at high risk for heart disease. What should I know about cancer screening? Many types of cancers can be detected early and may often be prevented. Depending on your health history and family history, you may need to have cancer screening at various ages. This may include screening for:  Colorectal cancer.  Prostate cancer.  Skin cancer.  Lung cancer. What should I know about heart disease, diabetes, and high blood pressure? Blood pressure and heart disease  High blood pressure causes heart disease and increases the risk of stroke. This is more likely to develop in people who have high blood pressure readings, are of African descent, or are overweight.  Talk with your health care provider about your target blood pressure readings.  Have your blood pressure checked: ? Every 3-5 years if you are 66-57 years of age. ? Every year if you are 69 years old or older.  If you are between the ages of 3 and 70 and are a current or former smoker, ask your health care provider if you should have a one-time screening for abdominal aortic aneurysm (AAA). Diabetes Have regular diabetes screenings. This checks your fasting blood sugar level. Have the screening done:  Once every three years after age 48 if you are at a normal weight and have a low risk for diabetes.  More often and at a younger age if you are overweight or have a high risk for diabetes. What should I know about preventing  infection? Hepatitis B If you have a higher risk for hepatitis B, you should be screened for this virus. Talk with your health care provider to find out if you are at risk for hepatitis B infection. Hepatitis C Blood testing is recommended for:  Everyone born from [birth year] through 1965.  Anyone with known risk factors for hepatitis C. Sexually transmitted infections (STIs)  You should be screened each year for STIs, including gonorrhea and chlamydia,  if: ? You are sexually active and are younger than 41 years of age. ? You are older than 41 years of age and your health care provider tells you that you are at risk for this type of infection. ? Your sexual activity has changed since you were last screened, and you are at increased risk for chlamydia or gonorrhea. Ask your health care provider if you are at risk.  Ask your health care provider about whether you are at high risk for HIV. Your health care provider may recommend a prescription medicine to help prevent HIV infection. If you choose to take medicine to prevent HIV, you should first get tested for HIV. You should then be tested every 3 months for as long as you are taking the medicine. Follow these instructions at home: Lifestyle  Do not use any products that contain nicotine or tobacco, such as cigarettes, e-cigarettes, and chewing tobacco. If you need help quitting, ask your health care provider.  Do not use street drugs.  Do not share needles.  Ask your health care provider for help if you need support or information about quitting drugs. Alcohol use  Do not drink alcohol if your health care provider tells you not to drink.  If you drink alcohol: ? Limit how much you have to 0-2 drinks a day. ? Be aware of how much alcohol is in your drink. In the [place] one drink equals one 12 oz bottle of beer (355 mL), one 5 oz glass of wine (148 mL), or one 1 oz glass of hard liquor (44 mL). General instructions  Schedule regular health, dental, and eye exams.  Stay current with your vaccines.  Tell your health care provider if: ? You often feel depressed. ? You have ever been abused or do not feel safe at home. Summary  Adopting a healthy lifestyle and getting preventive care are important in promoting health and wellness.  Follow your health care provider's instructions about healthy diet, exercising, and getting tested or screened for diseases.  Follow your health care  provider's instructions on monitoring your cholesterol and blood pressure. This information is not intended to replace advice given to you by your health care provider. Make sure you discuss any questions you have with your health care provider. Document Revised: 01/10/2018 Document Reviewed: 01/10/2018 Elsevier Patient Education  2020 [employer].

## 2019-10-30 NOTE — Assessment & Plan Note (Signed)
Congratulated on weight loss to date. Encouraged healthy diet and lifestyle changes to affect sustainable weight loss.  Motivated to continue regular bicycling.

## 2019-10-30 NOTE — Assessment & Plan Note (Signed)
Chronic, mild tachycardia. This does not limit activity. However with mildly elevated blood pressures I did recommend he try toprol XL 25mg  daily - last year did not start 50mg  dose due to concern over possible side effects.

## 2019-12-24 ENCOUNTER — Other Ambulatory Visit: Payer: Self-pay | Admitting: Family Medicine

## 2019-12-24 NOTE — Telephone Encounter (Signed)
Pharmacy requesting 90-day rx.  Is this ok to send?

## 2020-02-24 DIAGNOSIS — Z03818 Encounter for observation for suspected exposure to other biological agents ruled out: Secondary | ICD-10-CM | POA: Diagnosis not present

## 2020-04-18 ENCOUNTER — Other Ambulatory Visit: Payer: Self-pay | Admitting: Family Medicine

## 2020-04-27 ENCOUNTER — Other Ambulatory Visit: Payer: BC Managed Care – PPO

## 2020-05-12 ENCOUNTER — Other Ambulatory Visit (INDEPENDENT_AMBULATORY_CARE_PROVIDER_SITE_OTHER): Payer: BC Managed Care – PPO

## 2020-05-12 ENCOUNTER — Other Ambulatory Visit: Payer: Self-pay

## 2020-05-12 DIAGNOSIS — Z8249 Family history of ischemic heart disease and other diseases of the circulatory system: Secondary | ICD-10-CM | POA: Diagnosis not present

## 2020-05-12 DIAGNOSIS — E785 Hyperlipidemia, unspecified: Secondary | ICD-10-CM | POA: Diagnosis not present

## 2020-05-12 LAB — LIPID PANEL
Cholesterol: 194 mg/dL (ref 0–200)
HDL: 43.4 mg/dL (ref >39.00)
LDL Cholesterol: 128 mg/dL — ABNORMAL HIGH (ref 0–99)
NonHDL: 150.44
Total CHOL/HDL Ratio: 4
Triglycerides: 114 mg/dL (ref 0.0–149.0)
VLDL: 22.8 mg/dL (ref 0.0–40.0)

## 2020-05-14 LAB — LIPOPROTEIN A (LPA): Lipoprotein (a): 153 nmol/L — ABNORMAL HIGH (ref <75)

## 2020-05-17 ENCOUNTER — Encounter: Payer: Self-pay | Admitting: Family Medicine

## 2020-05-17 DIAGNOSIS — E7841 Elevated Lipoprotein(a): Secondary | ICD-10-CM | POA: Insufficient documentation

## 2020-10-18 ENCOUNTER — Other Ambulatory Visit: Payer: Self-pay | Admitting: Family Medicine

## 2020-12-14 ENCOUNTER — Other Ambulatory Visit: Payer: Self-pay | Admitting: Family Medicine

## 2021-01-16 ENCOUNTER — Other Ambulatory Visit: Payer: Self-pay | Admitting: Family Medicine

## 2021-02-15 ENCOUNTER — Telehealth: Payer: Self-pay | Admitting: Family Medicine

## 2021-02-22 ENCOUNTER — Telehealth: Payer: Self-pay

## 2021-02-22 ENCOUNTER — Telehealth: Payer: Self-pay | Admitting: Family Medicine

## 2021-02-22 NOTE — Telephone Encounter (Signed)
Noted.  Plz schedule lab and cpe visits.

## 2021-02-22 NOTE — Telephone Encounter (Signed)
Called to move appointment for CPE. Dr. Reece Agar will be out of the office. I did not see anything open any time soon. Was not sure if you were working in those patients or just give next available.

## 2021-03-02 NOTE — Telephone Encounter (Signed)
Noted  

## 2021-03-02 NOTE — Telephone Encounter (Signed)
Pt scheduled a cpe/lab in may 2023

## 2021-03-15 ENCOUNTER — Other Ambulatory Visit: Payer: Self-pay | Admitting: Family Medicine

## 2021-03-17 ENCOUNTER — Other Ambulatory Visit: Payer: BC Managed Care – PPO

## 2021-03-24 ENCOUNTER — Encounter: Payer: BC Managed Care – PPO | Admitting: Family Medicine

## 2021-04-18 ENCOUNTER — Other Ambulatory Visit: Payer: Self-pay | Admitting: Family Medicine

## 2021-04-19 NOTE — Telephone Encounter (Signed)
Pt is overdue for CPE and had an apt for Feb but it appears it was cancelled due to provider. Pt was RS for 06/14/21.  ? ?Sent in refill to make for 90 days to make it to the apt in May. Sent msg with refill that pt must keep apt on 5/15 for any further apts.  ?

## 2021-06-03 ENCOUNTER — Other Ambulatory Visit: Payer: Self-pay | Admitting: Family Medicine

## 2021-06-03 DIAGNOSIS — E785 Hyperlipidemia, unspecified: Secondary | ICD-10-CM

## 2021-06-03 DIAGNOSIS — I10 Essential (primary) hypertension: Secondary | ICD-10-CM

## 2021-06-03 DIAGNOSIS — R739 Hyperglycemia, unspecified: Secondary | ICD-10-CM

## 2021-06-03 DIAGNOSIS — R7303 Prediabetes: Secondary | ICD-10-CM | POA: Insufficient documentation

## 2021-06-07 ENCOUNTER — Other Ambulatory Visit (INDEPENDENT_AMBULATORY_CARE_PROVIDER_SITE_OTHER): Payer: BC Managed Care – PPO

## 2021-06-07 DIAGNOSIS — E785 Hyperlipidemia, unspecified: Secondary | ICD-10-CM | POA: Diagnosis not present

## 2021-06-07 DIAGNOSIS — R739 Hyperglycemia, unspecified: Secondary | ICD-10-CM

## 2021-06-07 DIAGNOSIS — I10 Essential (primary) hypertension: Secondary | ICD-10-CM | POA: Diagnosis not present

## 2021-06-07 LAB — LIPID PANEL
Cholesterol: 210 mg/dL — ABNORMAL HIGH (ref 0–200)
HDL: 44.8 mg/dL (ref >39.00)
LDL Cholesterol: 136 mg/dL — ABNORMAL HIGH (ref 0–99)
NonHDL: 165.48
Total CHOL/HDL Ratio: 5
Triglycerides: 146 mg/dL (ref 0.0–149.0)
VLDL: 29.2 mg/dL (ref 0.0–40.0)

## 2021-06-07 LAB — COMPREHENSIVE METABOLIC PANEL
ALT: 33 U/L (ref 0–53)
AST: 20 U/L (ref 0–37)
Albumin: 4.5 g/dL (ref 3.5–5.2)
Alkaline Phosphatase: 63 U/L (ref 39–117)
BUN: 11 mg/dL (ref 6–23)
CO2: 25 mEq/L (ref 19–32)
Calcium: 9.4 mg/dL (ref 8.4–10.5)
Chloride: 104 mEq/L (ref 96–112)
Creatinine, Ser: 0.95 mg/dL (ref 0.40–1.50)
GFR: 98.2 mL/min (ref >60.00)
Glucose, Bld: 127 mg/dL — ABNORMAL HIGH (ref 70–99)
Potassium: 3.7 mEq/L (ref 3.5–5.1)
Sodium: 139 mEq/L (ref 135–145)
Total Bilirubin: 0.6 mg/dL (ref 0.2–1.2)
Total Protein: 7.5 g/dL (ref 6.0–8.3)

## 2021-06-07 LAB — HEMOGLOBIN A1C: Hgb A1c MFr Bld: 5.5 % (ref 4.6–6.5)

## 2021-06-07 LAB — MICROALBUMIN / CREATININE URINE RATIO
Creatinine,U: 83.8 mg/dL
Microalb Creat Ratio: 0.8 mg/g (ref 0.0–30.0)
Microalb, Ur: <0.7 mg/dL (ref 0.0–1.9)

## 2021-06-14 ENCOUNTER — Encounter: Payer: Self-pay | Admitting: Family Medicine

## 2021-06-14 ENCOUNTER — Ambulatory Visit (INDEPENDENT_AMBULATORY_CARE_PROVIDER_SITE_OTHER): Payer: BC Managed Care – PPO | Admitting: Family Medicine

## 2021-06-14 VITALS — BP 130/70 | HR 76 | Temp 97.9°F | Ht 72.0 in | Wt 239.2 lb

## 2021-06-14 DIAGNOSIS — E785 Hyperlipidemia, unspecified: Secondary | ICD-10-CM

## 2021-06-14 DIAGNOSIS — I10 Essential (primary) hypertension: Secondary | ICD-10-CM

## 2021-06-14 DIAGNOSIS — Z23 Encounter for immunization: Secondary | ICD-10-CM

## 2021-06-14 DIAGNOSIS — E669 Obesity, unspecified: Secondary | ICD-10-CM

## 2021-06-14 DIAGNOSIS — Z0001 Encounter for general adult medical examination with abnormal findings: Secondary | ICD-10-CM | POA: Diagnosis not present

## 2021-06-14 DIAGNOSIS — R7401 Elevation of levels of liver transaminase levels: Secondary | ICD-10-CM

## 2021-06-14 DIAGNOSIS — Z8249 Family history of ischemic heart disease and other diseases of the circulatory system: Secondary | ICD-10-CM | POA: Diagnosis not present

## 2021-06-14 DIAGNOSIS — R000 Tachycardia, unspecified: Secondary | ICD-10-CM

## 2021-06-14 DIAGNOSIS — E7841 Elevated Lipoprotein(a): Secondary | ICD-10-CM

## 2021-06-14 DIAGNOSIS — R739 Hyperglycemia, unspecified: Secondary | ICD-10-CM

## 2021-06-14 DIAGNOSIS — G43809 Other migraine, not intractable, without status migrainosus: Secondary | ICD-10-CM

## 2021-06-14 MED ORDER — ATORVASTATIN CALCIUM 20 MG PO TABS: 20.0000 mg | ORAL_TABLET | ORAL | 3 refills | Status: DC

## 2021-06-14 MED ORDER — AMLODIPINE BESYLATE 10 MG PO TABS: 10.0000 mg | ORAL_TABLET | Freq: Every day | ORAL | 3 refills | Status: DC

## 2021-06-14 MED ORDER — METOPROLOL SUCCINATE ER 25 MG PO TB24: 25.0000 mg | ORAL_TABLET | Freq: Every day | ORAL | 3 refills | Status: DC

## 2021-06-14 MED ORDER — SUMATRIPTAN SUCCINATE 50 MG PO TABS: ORAL_TABLET | ORAL | 2 refills | Status: AC

## 2021-06-14 MED ORDER — HYDROXYZINE HCL 25 MG PO TABS: ORAL_TABLET | ORAL | 3 refills | Status: DC

## 2021-06-14 NOTE — Telephone Encounter (Signed)
Updated pt's chart.  Fyi to Dr. G.  

## 2021-06-14 NOTE — Assessment & Plan Note (Addendum)
Strong fmhx premature CAD (mother). He has elevated lipoprotein a (150s) putting him at high cardiovascular risk. Discussed this, recommend statin, he is hesitant. Offered cardiac CT for calcium scoring. He agrees to try once weekly atorvastatin 20mg  with option to increase dose as tolerated.  ?The 10-year ASCVD risk score (Arnett DK, et al., 2019) is: 3.6% ?  Values used to calculate the score: ?    Age: 41 years ?    Sex: Male ?    Is Non-Hispanic African American: No ?    Diabetic: No ?    Tobacco smoker: No ?    Systolic Blood Pressure: 162 mmHg ?    Is BP treated: Yes ?    HDL Cholesterol: 44.8 mg/dL ?    Total Cholesterol: 210 mg/dL  ?

## 2021-06-14 NOTE — Assessment & Plan Note (Signed)
Impaired fasting sugar but A1c normal. Consider fructosamine next labs  ?

## 2021-06-14 NOTE — Assessment & Plan Note (Signed)
Lp(a) 153 (high risk) (2021) - discussed this, see above.  ?

## 2021-06-14 NOTE — Assessment & Plan Note (Signed)
Chronic, stable on current regimen of amlodipine and toprol XL. Home readings largely well controlled. BP elevated when he comes in to office - anticipate component of white coat hypertension. No changes indicated at this time.  ?

## 2021-06-14 NOTE — Assessment & Plan Note (Addendum)
Mother at age 50s.  ?See below.  ?

## 2021-06-14 NOTE — Assessment & Plan Note (Signed)
Congratulated on healthy diet and lifestyle implemented changes recently. He is motivated to continue healthy changes.  ?

## 2021-06-14 NOTE — Assessment & Plan Note (Signed)
Preventative protocols reviewed and updated unless pt declined. Discussed healthy diet and lifestyle.  

## 2021-06-14 NOTE — Assessment & Plan Note (Signed)
Overall improved on low dose Toprol XL - continue this.  ?

## 2021-06-14 NOTE — Patient Instructions (Addendum)
Send [place] dates of Moderna booster.  ?Tdap today.  ?Due to elevated personal cardiovascular risk, I recommend starting cholesterol medicine - try atorvastatin (Lipitor) 20mg  once weekly, increase frequency as tolerated (ideally daily).  ?You are doing well today.  ?Return as needed or in 1 year for next physical.  ? ?Health Maintenance, Male ?Adopting a healthy lifestyle and getting preventive care are important in promoting health and wellness. Ask your health care provider about: ?The right schedule for you to have regular tests and exams. ?Things you can do on your own to prevent diseases and keep yourself healthy. ?What should I know about diet, weight, and exercise? ?Eat a healthy diet ? ?Eat a diet that includes plenty of vegetables, fruits, low-fat dairy products, and lean protein. ?Do not eat a lot of foods that are high in solid fats, added sugars, or sodium. ?Maintain a healthy weight ?Body mass index (BMI) is a measurement that can be used to identify possible weight problems. It estimates body fat based on height and weight. Your health care provider can help determine your BMI and help you achieve or maintain a healthy weight. ?Get regular exercise ?Get regular exercise. This is one of the most important things you can do for your health. Most adults should: ?Exercise for at least 150 minutes each week. The exercise should increase your heart rate and make you sweat (moderate-intensity exercise). ?Do strengthening exercises at least twice a week. This is in addition to the moderate-intensity exercise. ?Spend less time sitting. Even light physical activity can be beneficial. ?Watch cholesterol and blood lipids ?Have your blood tested for lipids and cholesterol at 43 years of age, then have this test every 5 years. ?You may need to have your cholesterol levels checked more often if: ?Your lipid or cholesterol levels are high. ?You are older than 43 years of age. ?You are at high risk for heart  disease. ?What should I know about cancer screening? ?Many types of cancers can be detected early and may often be prevented. Depending on your health history and family history, you may need to have cancer screening at various ages. This may include screening for: ?Colorectal cancer. ?Prostate cancer. ?Skin cancer. ?Lung cancer. ?What should I know about heart disease, diabetes, and high blood pressure? ?Blood pressure and heart disease ?High blood pressure causes heart disease and increases the risk of stroke. This is more likely to develop in people who have high blood pressure readings or are overweight. ?Talk with your health care provider about your target blood pressure readings. ?Have your blood pressure checked: ?Every 3-5 years if you are 84-51 years of age. ?Every year if you are 54 years old or older. ?If you are between the ages of 9 and 56 and are a current or former smoker, ask your health care provider if you should have a one-time screening for abdominal aortic aneurysm (AAA). ?Diabetes ?Have regular diabetes screenings. This checks your fasting blood sugar level. Have the screening done: ?Once every three years after age 58 if you are at a normal weight and have a low risk for diabetes. ?More often and at a younger age if you are overweight or have a high risk for diabetes. ?What should I know about preventing infection? ?Hepatitis B ?If you have a higher risk for hepatitis B, you should be screened for this virus. Talk with your health care provider to find out if you are at risk for hepatitis B infection. ?Hepatitis C ?Blood testing is recommended for: ?  Everyone born from [birth year] through 1965. ?Anyone with known risk factors for hepatitis C. ?Sexually transmitted infections (STIs) ?You should be screened each year for STIs, including gonorrhea and chlamydia, if: ?You are sexually active and are younger than 43 years of age. ?You are older than 43 years of age and your health care provider tells you  that you are at risk for this type of infection. ?Your sexual activity has changed since you were last screened, and you are at increased risk for chlamydia or gonorrhea. Ask your health care provider if you are at risk. ?Ask your health care provider about whether you are at high risk for HIV. Your health care provider may recommend a prescription medicine to help prevent HIV infection. If you choose to take medicine to prevent HIV, you should first get tested for HIV. You should then be tested every 3 months for as long as you are taking the medicine. ?Follow these instructions at home: ?Alcohol use ?Do not drink alcohol if your health care provider tells you not to drink. ?If you drink alcohol: ?Limit how much you have to 0-2 drinks a day. ?Know how much alcohol is in your drink. In the [place] one drink equals one 12 oz bottle of beer (355 mL), one 5 oz glass of wine (148 mL), or one 1? oz glass of hard liquor (44 mL). ?Lifestyle ?Do not use any products that contain nicotine or tobacco. These products include cigarettes, chewing tobacco, and vaping devices, such as e-cigarettes. If you need help quitting, ask your health care provider. ?Do not use street drugs. ?Do not share needles. ?Ask your health care provider for help if you need support or information about quitting drugs. ?General instructions ?Schedule regular health, dental, and eye exams. ?Stay current with your vaccines. ?Tell your health care provider if: ?You often feel depressed. ?You have ever been abused or do not feel safe at home. ?Summary ?Adopting a healthy lifestyle and getting preventive care are important in promoting health and wellness. ?Follow your health care provider's instructions about healthy diet, exercising, and getting tested or screened for diseases. ?Follow your health care provider's instructions on monitoring your cholesterol and blood pressure. ?This information is not intended to replace advice given to you by your health  care provider. Make sure you discuss any questions you have with your health care provider. ?Document Revised: 06/08/2020 Document Reviewed: 06/08/2020 ?Elsevier Patient Education ? 2023 Elsevier Inc. ? ?

## 2021-06-14 NOTE — Assessment & Plan Note (Addendum)
LFTs normalized with weight loss. Anticipate component of fatty liver.  ?

## 2021-06-14 NOTE — Assessment & Plan Note (Addendum)
Stable period on PRN excedrin and imitrex - improvement with health diet and lifestyle noted.  ?

## 2021-06-14 NOTE — Progress Notes (Signed)
? ? Patient ID: Derrick Santiago, male    DOB: 1980/05/19, 41 y.o.   MRN: [id number] ? ?This visit was conducted in person. ? ?BP 130/70 (BP Location: Right Arm, Cuff Size: Large) Comment: at home this morning  Pulse 76   Temp 97.9 ?F (36.6 ?C) (Temporal)   Ht 6' (1.829 m)   Wt 239 lb 4 oz (108.5 kg)   SpO2 99%   BMI 32.45 kg/m?   ?BP Readings from Last 3 Encounters:  ?06/14/21 130/70  ?10/30/19 140/88  ?04/09/18 140/86  ?  ?CC: CPE ?Subjective:  ? ?HPI: ?Derrick SaxRickey L Santiago is a 41 y.o. male presenting on 06/14/2021 for Annual Exam ? ? ?BP elevated today despite amlodipine 10mg  daily and toprol XL 25mg  daily. Resting heart rate has gone from 90s to 70-80s. He has noted some difficulty getting his heart rate up. He's increased intensity of biking exercise, now 4-5 times a week. Has i[employer] cycle - exercise goal is 500 cal/day.  ? ?Home BP readings - 130/70 this morning ? ?Drinking apple cider vinegar with water every morning. 64+ oz water/fluid daily.  ? ?Migraines have improved with increasing exercise and healthy diet - down to 1-2 every 3 months.  ?  ?Tachycardia - improved on daily Toprol XL  ?Caffeine - 1-2 cups coffee/day. Drinking 1/2 gallon water/day - goal is 3 quarts/day.  ? ?Preventative: ?No fmhx colon or prostate cancer ?Flu - today ?COVID vaccine - Pfizer 04/2019, 05/2019, Moderna booster 02/2020 ?Tdap: 10/2010, rpt today  ?Seat belt use discussed.  ?Sunscreen use discussed. No changing moles on skin.  ?Sleep - averaging 7-8 hours/night ?Non smoker  ?Alcohol - cut down significantly to 1-2 drinks occ. Previously 3-4/day ?Dentist - due ?Eye exam yearly ? 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   ? ?Relevant past medical, surgical, family and social history reviewed and updated as indicated. Interim medical  history since our last visit reviewed. ?Allergies and medications reviewed and updated. ?Outpatient Medications Prior to Visit  ?Medication Sig Dispense Refill  ? aspirin-acetaminophen-caffeine (EXCEDRIN MIGRAINE) 250-250-65 MG per tablet Take 2 tablets by mouth as needed.      ? Krill Oil 300 MG CAPS Take 1 capsule by mouth daily.    ? Multiple Vitamin (MULTIVITAMIN) tablet Take 1 tablet by mouth daily.    ? amLODipine (NORVASC) 10 MG tablet TAKE 1 TABLET BY MOUTH EVERY DAY 90 tablet 1  ? hydrOXYzine (ATARAX/VISTARIL) 25 MG tablet TAKE 1 TABLET BY MOUTH TWICE A DAY AS NEEDED FOR ANXIETY (SEDATION PRECAUTION) 180 tablet 1  ? metoprolol succinate (TOPROL-XL) 25 MG 24 hr tablet Take 1 tablet (25 mg total) by mouth daily. MUST KEEP MARCH 2023 OFFICE VISIT FOR MORE REFILLS 90 tablet 0  ? SUMAtriptan (IMITREX) 50 MG tablet TAKE 1 TABLET BY MOUTH DAILY AS NEEDED FOR MIGRAINE. MAY REPEAT IN 2 HRS IF NOT RELIEVED. 10 tablet 2  ? loratadine (CLARITIN) 10 MG tablet Take 10 mg by mouth daily as needed.    ? ibuprofen (ADVIL,MOTRIN) 200 MG tablet Take 400 mg by mouth as needed.     ? ?No facility-administered medications prior to visit.  ?  ? ?Per HPI unless specifically indicated in ROS section below ?Review of Systems  ?Constitutional:  Negative for activity change, appetite change, chills, fatigue,  fever and unexpected weight change.  ?HENT:  Negative for hearing loss.   ?Eyes:  Negative for visual disturbance.  ?Respiratory:  Negative for cough, chest tightness, shortness of breath and wheezing.   ?Cardiovascular:  Negative for chest pain, palpitations and leg swelling.  ?Gastrointestinal:  Negative for abdominal distention, abdominal pain, blood in stool, constipation, diarrhea, nausea and vomiting.  ?Genitourinary:  Negative for difficulty urinating and hematuria.  ?Musculoskeletal:  Negative for arthralgias, myalgias and neck pain.  ?Skin:  Negative for rash.  ?Neurological:  Negative for dizziness, seizures, syncope and  headaches.  ?Hematological:  Negative for adenopathy. Does not bruise/bleed easily.  ?Psychiatric/Behavioral:  Negative for dysphoric mood. The patient is not nervous/anxious.   ? ?Objective:  ?BP 130/70 (BP Location: Right Arm, Cuff Size: Large) Comment: at home this morning  Pulse 76   Temp 97.9 ?F (36.6 ?C) (Temporal)   Ht 6' (1.829 m)   Wt 239 lb 4 oz (108.5 kg)   SpO2 99%   BMI 32.45 kg/m?   ?Wt Readings from Last 3 Encounters:  ?06/14/21 239 lb 4 oz (108.5 kg)  ?10/30/19 243 lb 1 oz (110.3 kg)  ?04/09/18 250 lb 8 oz (113.6 kg)  ?  ?  ?Physical Exam ?Vitals and nursing note reviewed.  ?Constitutional:   ?   General: He is not in acute distress. ?   Appearance: Normal appearance. He is well-developed. He is not ill-appearing.  ?HENT:  ?   Head: Normocephalic and atraumatic.  ?   Right Ear: Hearing, tympanic membrane, ear canal and external ear normal.  ?   Left Ear: Hearing, tympanic membrane, ear canal and external ear normal.  ?Eyes:  ?   General: No scleral icterus. ?   Extraocular Movements: Extraocular movements intact.  ?   Conjunctiva/sclera: Conjunctivae normal.  ?   Pupils: Pupils are equal, round, and reactive to light.  ?Neck:  ?   Thyroid: No thyroid mass or thyromegaly.  ?Cardiovascular:  ?   Rate and Rhythm: Normal rate and regular rhythm.  ?   Pulses: Normal pulses.     ?     Radial pulses are 2+ on the right side and 2+ on the left side.  ?   Heart sounds: Normal heart sounds. No murmur heard. ?Pulmonary:  ?   Effort: Pulmonary effort is normal. No respiratory distress.  ?   Breath sounds: Normal breath sounds. No wheezing, rhonchi or rales.  ?Abdominal:  ?   General: Bowel sounds are normal. There is no distension.  ?   Palpations: Abdomen is soft. There is no mass.  ?   Tenderness: There is no abdominal tenderness. There is no guarding or rebound.  ?   Hernia: No hernia is present.  ?Musculoskeletal:     ?   General: Normal range of motion.  ?   Cervical back: Normal range of motion and  neck supple.  ?   Right lower leg: No edema.  ?   Left lower leg: No edema.  ?Lymphadenopathy:  ?   Cervical: No cervical adenopathy.  ?Skin: ?   General: Skin is warm and dry.  ?   Findings: No rash.  ?Neurological:  ?   General: No focal deficit present.  ?   Mental Status: He is alert and oriented to person, place, and time.  ?Psychiatric:     ?   Mood and Affect: Mood normal.     ?   Behavior: Behavior normal.     ?  Thought Content: Thought content normal.     ?   Judgment: Judgment normal.  ? ?   ?Results for orders placed or performed in visit on 06/07/21  ?Hemoglobin A1c  ?Result Value Ref Range  ? Hgb A1c MFr Bld 5.5 4.6 - 6.5 %  ?Microalbumin / creatinine urine ratio  ?Result Value Ref Range  ? Microalb, Ur <0.7 0.0 - 1.9 mg/dL  ? Creatinine,U 83.8 mg/dL  ? Microalb Creat Ratio 0.8 0.0 - 30.0 mg/g  ?Comprehensive metabolic panel  ?Result Value Ref Range  ? Sodium 139 135 - 145 mEq/L  ? Potassium 3.7 3.5 - 5.1 mEq/L  ? Chloride 104 96 - 112 mEq/L  ? CO2 25 19 - 32 mEq/L  ? Glucose, Bld 127 (H) 70 - 99 mg/dL  ? BUN 11 6 - 23 mg/dL  ? Creatinine, Ser 0.95 0.40 - 1.50 mg/dL  ? Total Bilirubin 0.6 0.2 - 1.2 mg/dL  ? Alkaline Phosphatase 63 39 - 117 U/L  ? AST 20 0 - 37 U/L  ? ALT 33 0 - 53 U/L  ? Total Protein 7.5 6.0 - 8.3 g/dL  ? Albumin 4.5 3.5 - 5.2 g/dL  ? GFR 98.20 >60.00 mL/min  ? Calcium 9.4 8.4 - 10.5 mg/dL  ?Lipid panel  ?Result Value Ref Range  ? Cholesterol 210 (H) 0 - 200 mg/dL  ? Triglycerides 146.0 0.0 - 149.0 mg/dL  ? HDL 44.80 >39.00 mg/dL  ? VLDL 29.2 0.0 - 40.0 mg/dL  ? LDL Cholesterol 136 (H) 0 - 99 mg/dL  ? Total CHOL/HDL Ratio 5   ? NonHDL 165.48   ? ? ?Assessment & Plan:  ? ?Problem List Items Addressed This Visit   ? ? Encounter for general adult medical examination with abnormal findings - Primary (Chronic)  ?  Preventative protocols reviewed and updated unless pt declined. ?Discussed healthy diet and lifestyle.  ? ?  ?  ? Migraines  ?  Stable period on PRN excedrin and imitrex -  improvement with health diet and lifestyle noted.  ? ?  ?  ? Relevant Medications  ? amLODipine (NORVASC) 10 MG tablet  ? metoprolol succinate (TOPROL-XL) 25 MG 24 hr tablet  ? SUMAtriptan (IMITREX) 50 MG tablet  ? ator

## 2021-06-23 ENCOUNTER — Other Ambulatory Visit: Payer: Self-pay | Admitting: Family Medicine

## 2021-07-05 ENCOUNTER — Ambulatory Visit: Payer: BC Managed Care – PPO | Admitting: Family Medicine

## 2021-07-05 ENCOUNTER — Encounter: Payer: Self-pay | Admitting: Family Medicine

## 2021-07-05 VITALS — BP 130/78 | HR 94 | Temp 98.5°F | Ht 72.0 in | Wt 243.1 lb

## 2021-07-05 DIAGNOSIS — B029 Zoster without complications: Secondary | ICD-10-CM

## 2021-07-05 MED ORDER — VALACYCLOVIR HCL 1 G PO TABS: 1000.0000 mg | ORAL_TABLET | Freq: Three times a day (TID) | ORAL | 0 refills | Status: DC

## 2021-07-05 MED ORDER — GABAPENTIN 300 MG PO CAPS: 300.0000 mg | ORAL_CAPSULE | Freq: Three times a day (TID) | ORAL | 3 refills | Status: DC

## 2021-07-05 NOTE — Telephone Encounter (Signed)
Lvm asking pt to call back.  Needs OV for rash.   I also sent MyChart message.

## 2021-07-05 NOTE — Progress Notes (Signed)
Derrick Santiago T. Leeandra Ellerson, MD, CAQ [facility] [address] [place], [address]  Phone: [phone]  FAX: [phone]  Derrick Santiago - 41 y.o. male  MRN [id number]  Date of Birth: July 06, 1980  Date: 07/05/2021  PCP: Eustaquio Boyden, MD  Referral: Eustaquio Boyden, MD  Chief Complaint  Patient presents with   Rash    On left side of back   Subjective:   Derrick Santiago is a 41 y.o. very pleasant male patient with Body mass index is 32.97 kg/m. who presents with the following:  He developed a painful vesicular rash on Saturday.  Now hurting and burning, and it woke him up this morning around 5 AM.  He has no known exposure.  It is painful and burning.  Nothing he has tried at home is helped.  Review of Systems is noted in the HPI, as appropriate  Objective:   BP 130/78   Pulse 94   Temp 98.5 F (36.9 C) (Oral)   Ht 6' (1.829 m)   Wt 243 lb 1 oz (110.3 kg)   SpO2 96%   BMI 32.97 kg/m   GEN: No acute distress; alert,appropriate. PULM: Breathing comfortably in no respiratory distress PSYCH: Normally interactive.      Laboratory and Imaging Data:  Assessment and Plan:     ICD-10-CM   1. Herpes zoster without complication  B02.9      Classic shingles.  Treat with Valtrex.  Reviewed shingles in general.  He can try gabapentin in 1 to 2 days if his symptoms are worsening and he has significant pain.  Otherwise he can take some Tylenol and ibuprofen.  Medication Management during today's office visit: Meds ordered this encounter  Medications   valACYclovir (VALTREX) 1000 MG tablet    Sig: Take 1 tablet (1,000 mg total) by mouth 3 (three) times daily.    Dispense:  21 tablet    Refill:  0   gabapentin (NEURONTIN) 300 MG capsule    Sig: Take 1 capsule (300 mg total) by mouth 3 (three) times daily.    Dispense:  90 capsule    Refill:  3   There are no discontinued medications.  Orders placed  today for conditions managed today: No orders of the defined types were placed in this encounter.   Follow-up if needed: No follow-ups on file.  Dr[employer] One speech-to-text software was used for transcription in this dictation.  Possible transcriptional errors can occur using Animal nutritionist.   Signed,  Elpidio Galea. Byard Carranza, MD   Outpatient Encounter Medications as of 07/05/2021  Medication Sig   amLODipine (NORVASC) 10 MG tablet Take 1 tablet (10 mg total) by mouth daily.   aspirin-acetaminophen-caffeine (EXCEDRIN MIGRAINE) 250-250-65 MG per tablet Take 2 tablets by mouth as needed.     atorvastatin (LIPITOR) 20 MG tablet Take 1 tablet (20 mg total) by mouth 2 (two) times a week.   gabapentin (NEURONTIN) 300 MG capsule Take 1 capsule (300 mg total) by mouth 3 (three) times daily.   hydrOXYzine (ATARAX) 25 MG tablet TAKE 1 TABLET BY MOUTH TWICE A DAY AS NEEDED FOR ANXIETY (SEDATION PRECAUTION)   Krill Oil 300 MG CAPS Take 1 capsule by mouth daily.   loratadine (CLARITIN) 10 MG tablet Take 10 mg by mouth daily as needed.   metoprolol succinate (TOPROL-XL) 25 MG 24 hr tablet Take 1 tablet (25 mg total) by mouth daily.   Multiple Vitamin (MULTIVITAMIN) tablet Take  1 tablet by mouth daily.   SUMAtriptan (IMITREX) 50 MG tablet TAKE 1 TABLET BY MOUTH DAILY AS NEEDED FOR MIGRAINE. MAY REPEAT IN 2 HRS IF NOT RELIEVED.   valACYclovir (VALTREX) 1000 MG tablet Take 1 tablet (1,000 mg total) by mouth 3 (three) times daily.   No facility-administered encounter medications on file as of 07/05/2021.

## 2021-07-05 NOTE — Patient Instructions (Signed)
Gabapentin 300 mg - start only with one at night if you need it.

## 2021-08-11 ENCOUNTER — Other Ambulatory Visit: Payer: Self-pay | Admitting: Family Medicine

## 2021-08-12 NOTE — Telephone Encounter (Signed)
Ok to send 180 tabs per pharmacy request?

## 2021-08-13 NOTE — Telephone Encounter (Signed)
error 

## 2022-05-26 ENCOUNTER — Other Ambulatory Visit: Payer: Self-pay | Admitting: Family Medicine

## 2022-05-26 DIAGNOSIS — I10 Essential (primary) hypertension: Secondary | ICD-10-CM

## 2022-06-16 ENCOUNTER — Telehealth: Payer: Self-pay | Admitting: Family Medicine

## 2022-06-16 NOTE — Telephone Encounter (Signed)
Patient is coming in for CPE tomorrow 06/17/2022.However when he was scheduled last year,he wasn't scheduled for prior labwork. He is okay with having them done same day(tomorrow)when he comes in,and he knows to be fasting. Could orders be placed please for CPE labs?

## 2022-06-16 NOTE — Telephone Encounter (Signed)
Spoke with pt notifying him since his appt is tomorrow, we just do them after he sees Dr. Reece Agar. Pt verbalizes understanding and will be fasting for labwork afterwards.

## 2022-06-17 ENCOUNTER — Encounter: Payer: Self-pay | Admitting: Family Medicine

## 2022-06-17 ENCOUNTER — Ambulatory Visit (INDEPENDENT_AMBULATORY_CARE_PROVIDER_SITE_OTHER): Payer: BC Managed Care – PPO | Admitting: Family Medicine

## 2022-06-17 VITALS — BP 115/77 | HR 98 | Temp 97.8°F | Ht 72.0 in | Wt 238.2 lb

## 2022-06-17 DIAGNOSIS — Z8249 Family history of ischemic heart disease and other diseases of the circulatory system: Secondary | ICD-10-CM | POA: Diagnosis not present

## 2022-06-17 DIAGNOSIS — R739 Hyperglycemia, unspecified: Secondary | ICD-10-CM | POA: Diagnosis not present

## 2022-06-17 DIAGNOSIS — I10 Essential (primary) hypertension: Secondary | ICD-10-CM | POA: Diagnosis not present

## 2022-06-17 DIAGNOSIS — E66811 Obesity, class 1: Secondary | ICD-10-CM

## 2022-06-17 DIAGNOSIS — E785 Hyperlipidemia, unspecified: Secondary | ICD-10-CM

## 2022-06-17 DIAGNOSIS — Z0001 Encounter for general adult medical examination with abnormal findings: Secondary | ICD-10-CM

## 2022-06-17 DIAGNOSIS — H7393 Unspecified disorder of tympanic membrane, bilateral: Secondary | ICD-10-CM | POA: Insufficient documentation

## 2022-06-17 DIAGNOSIS — E7841 Elevated Lipoprotein(a): Secondary | ICD-10-CM

## 2022-06-17 DIAGNOSIS — G43809 Other migraine, not intractable, without status migrainosus: Secondary | ICD-10-CM

## 2022-06-17 DIAGNOSIS — E669 Obesity, unspecified: Secondary | ICD-10-CM

## 2022-06-17 LAB — COMPREHENSIVE METABOLIC PANEL
ALT: 37 U/L (ref 0–53)
AST: 23 U/L (ref 0–37)
Albumin: 4.5 g/dL (ref 3.5–5.2)
Alkaline Phosphatase: 67 U/L (ref 39–117)
BUN: 10 mg/dL (ref 6–23)
CO2: 28 mEq/L (ref 19–32)
Calcium: 9.5 mg/dL (ref 8.4–10.5)
Chloride: 104 mEq/L (ref 96–112)
Creatinine, Ser: 0.9 mg/dL (ref 0.40–1.50)
GFR: 104.02 mL/min (ref >60.00)
Glucose, Bld: 117 mg/dL — ABNORMAL HIGH (ref 70–99)
Potassium: 3.8 mEq/L (ref 3.5–5.1)
Sodium: 139 mEq/L (ref 135–145)
Total Bilirubin: 0.6 mg/dL (ref 0.2–1.2)
Total Protein: 7.3 g/dL (ref 6.0–8.3)

## 2022-06-17 LAB — LIPID PANEL
Cholesterol: 150 mg/dL (ref 0–200)
HDL: 40.3 mg/dL (ref >39.00)
LDL Cholesterol: 91 mg/dL (ref 0–99)
NonHDL: 110.02
Total CHOL/HDL Ratio: 4
Triglycerides: 95 mg/dL (ref 0.0–149.0)
VLDL: 19 mg/dL (ref 0.0–40.0)

## 2022-06-17 LAB — HEMOGLOBIN A1C: Hgb A1c MFr Bld: 5.5 % (ref 4.6–6.5)

## 2022-06-17 MED ORDER — COENZYME Q10 30 MG PO CAPS: 30.0000 mg | ORAL_CAPSULE | ORAL | Status: AC

## 2022-06-17 MED ORDER — ATORVASTATIN CALCIUM 20 MG PO TABS
20.0000 mg | ORAL_TABLET | ORAL | 4 refills | Status: DC
Start: 2022-06-17 — End: 2023-07-05

## 2022-06-17 MED ORDER — ACETIC ACID 2 % OT SOLN: 4.0000 [drp] | Freq: Four times a day (QID) | OTIC | 0 refills | Status: DC

## 2022-06-17 MED ORDER — AMLODIPINE BESYLATE 10 MG PO TABS: 10.0000 mg | ORAL_TABLET | Freq: Every day | ORAL | 4 refills | Status: DC

## 2022-06-17 MED ORDER — METOPROLOL SUCCINATE ER 25 MG PO TB24: 25.0000 mg | ORAL_TABLET | Freq: Every day | ORAL | 4 refills | Status: DC

## 2022-06-17 NOTE — Assessment & Plan Note (Signed)
Update A1c ?

## 2022-06-17 NOTE — Assessment & Plan Note (Signed)
Preventative protocols reviewed and updated unless pt declined. Discussed healthy diet and lifestyle.  

## 2022-06-17 NOTE — Patient Instructions (Signed)
Fasting labs today  Ear canal/drums look irritated - back off ear cleaning at home.  BP staying overall great at home - continue current medicines Return as needed or in 1 year for next physical. Bring in BP cuff to that visit

## 2022-06-17 NOTE — Progress Notes (Signed)
Ph: [phone] Fax: [phone]   Patient ID: Derrick Santiago, male    DOB: 23-Feb-1980, 42 y.o.   MRN: [id number]  This visit was conducted in person.  BP 115/77 Comment: with home BP cuff this morning  Pulse 98   Temp 97.8 F (36.6 C) (Temporal)   Ht 6' (1.829 m)   Wt 238 lb 4 oz (108.1 kg)   SpO2 98%   BMI 32.31 kg/m   144/88 on recheck in office CC: CPE Subjective:   HPI: Derrick Santiago is a 42 y.o. male presenting on 06/17/2022 for Annual Exam   BP elevated today - anticipate component of white coat hypertension.  Home BP reading this morning 115/77.   Migraines - significantly improved with better BP control at home. 1 every month, manages with excedrin migraine followed by imitrex if needed with benefit.   Caffeine - 1-2 cups coffee/day. Drinking 1/2 gallon water/day - goal is 3 quarts/day.   Shinglex 07/2021 - took Valtrex with benefit. Did not need gabapentin.   HLD - he is on atorvastatin 20mg  twice weekly.    Preventative: No fmhx colon or prostate cancer Flu - today COVID vaccine - Pfizer 04/2019, 05/2019, Moderna booster 02/2020 Tdap: 10/2010, 05/2021  Seat belt use discussed.  Sunscreen use discussed. No changing moles on skin.  Sleep - averaging 7 hours/night Non smoker  Alcohol - cut down significantly to 1-2 drinks occ.  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 Interim medical history since our last visit reviewed. Allergies and medications reviewed and updated. Outpatient Medications Prior to Visit  Medication Sig Dispense Refill   aspirin-acetaminophen-caffeine  (EXCEDRIN MIGRAINE) 250-250-65 MG per tablet Take 2 tablets by mouth as needed.       hydrOXYzine (ATARAX) 25 MG tablet TAKE 1 TABLET BY MOUTH TWICE A DAY AS NEEDED FOR ANXIETY (SEDATION PRECAUTION) 180 tablet 1   Krill Oil 300 MG CAPS Take 1 capsule by mouth daily.     loratadine (CLARITIN) 10 MG tablet Take 10 mg by mouth daily as needed.     Multiple Vitamin (MULTIVITAMIN) tablet Take 1 tablet by mouth daily.     SUMAtriptan (IMITREX) 50 MG tablet TAKE 1 TABLET BY MOUTH DAILY AS NEEDED FOR MIGRAINE. MAY REPEAT IN 2 HRS IF NOT RELIEVED. 10 tablet 2   amLODipine (NORVASC) 10 MG tablet Take 1 tablet (10 mg total) by mouth daily. 90 tablet 3   atorvastatin (LIPITOR) 20 MG tablet Take 1 tablet (20 mg total) by mouth 2 (two) times a week. 30 tablet 3   metoprolol succinate (TOPROL-XL) 25 MG 24 hr tablet TAKE 1 TABLET (25 MG TOTAL) BY MOUTH DAILY. 90 tablet 0   gabapentin (NEURONTIN) 300 MG capsule Take 1 capsule (300 mg total) by mouth 3 (three) times daily. 90 capsule 3   valACYclovir (VALTREX) 1000 MG tablet Take 1 tablet (1,000 mg total) by mouth 3 (three) times daily. 21 tablet 0   No facility-administered medications prior to visit.     Per HPI unless specifically indicated in ROS section below Review of Systems  Constitutional:  Negative for activity change, appetite change, chills, fatigue, fever and unexpected weight change.  HENT:  Negative for hearing loss.   Eyes:  Negative for visual disturbance.  Respiratory:  Negative for cough, chest tightness, shortness of breath and wheezing.   Cardiovascular:  Negative for chest pain, palpitations and leg swelling.  Gastrointestinal:  Negative for abdominal distention, abdominal pain, blood in stool, constipation, diarrhea, nausea and vomiting.  Genitourinary:  Negative for difficulty urinating and hematuria.  Musculoskeletal:  Negative for arthralgias, myalgias and neck pain.  Skin:  Negative for rash.  Neurological:  Negative for dizziness,  seizures, syncope and headaches.  Hematological:  Negative for adenopathy. Does not bruise/bleed easily.  Psychiatric/Behavioral:  Negative for dysphoric mood. The patient is not nervous/anxious.     Objective:  BP 115/77 Comment: with home BP cuff this morning  Pulse 98   Temp 97.8 F (36.6 C) (Temporal)   Ht 6' (1.829 m)   Wt 238 lb 4 oz (108.1 kg)   SpO2 98%   BMI 32.31 kg/m   Wt Readings from Last 3 Encounters:  06/17/22 238 lb 4 oz (108.1 kg)  07/05/21 243 lb 1 oz (110.3 kg)  06/14/21 239 lb 4 oz (108.5 kg)      Physical Exam Vitals and nursing note reviewed.  Constitutional:      General: He is not in acute distress.    Appearance: Normal appearance. He is well-developed. He is not ill-appearing.  HENT:     Head: Normocephalic and atraumatic.     Right Ear: Hearing, tympanic membrane, ear canal and external ear normal.     Left Ear: Hearing, tympanic membrane, ear canal and external ear normal.     Mouth/Throat:     Mouth: Mucous membranes are moist.     Pharynx: Oropharynx is clear. No oropharyngeal exudate or posterior oropharyngeal erythema.  Eyes:     General: No scleral icterus.    Extraocular Movements: Extraocular movements intact.     Conjunctiva/sclera: Conjunctivae normal.     Pupils: Pupils are equal, round, and reactive to light.  Neck:     Thyroid: No thyroid mass or thyromegaly.  Cardiovascular:     Rate and Rhythm: Normal rate and regular rhythm.     Pulses: Normal pulses.          Radial pulses are 2+ on the right side and 2+ on the left side.     Heart sounds: Normal heart sounds. No murmur heard. Pulmonary:     Effort: Pulmonary effort is normal. No respiratory distress.     Breath sounds: Normal breath sounds. No wheezing, rhonchi or rales.  Abdominal:     General: Bowel sounds are normal. There is no distension.     Palpations: Abdomen is soft. There is no mass.     Tenderness: There is no abdominal tenderness. There is no guarding or  rebound.     Hernia: No hernia is present.  Musculoskeletal:        General: Normal range of motion.     Cervical back: Normal range of motion and neck supple.     Right lower leg: No edema.     Left lower leg: No edema.  Lymphadenopathy:     Cervical: No cervical adenopathy.  Skin:    General: Skin is warm and dry.     Findings: No rash.  Neurological:     General: No focal deficit present.     Mental Status: He is alert and oriented to  person, place, and time.  Psychiatric:        Mood and Affect: Mood normal.        Behavior: Behavior normal.        Thought Content: Thought content normal.        Judgment: Judgment normal.       Results for orders placed or performed in visit on 06/07/21  Hemoglobin A1c  Result Value Ref Range   Hgb A1c MFr Bld 5.5 4.6 - 6.5 %  Microalbumin / creatinine urine ratio  Result Value Ref Range   Microalb, Ur <0.7 0.0 - 1.9 mg/dL   Creatinine,U 16.1 mg/dL   Microalb Creat Ratio 0.8 0.0 - 30.0 mg/g  Comprehensive metabolic panel  Result Value Ref Range   Sodium 139 135 - 145 mEq/L   Potassium 3.7 3.5 - 5.1 mEq/L   Chloride 104 96 - 112 mEq/L   CO2 25 19 - 32 mEq/L   Glucose, Bld 127 (H) 70 - 99 mg/dL   BUN 11 6 - 23 mg/dL   Creatinine, Ser 0.96 0.40 - 1.50 mg/dL   Total Bilirubin 0.6 0.2 - 1.2 mg/dL   Alkaline Phosphatase 63 39 - 117 U/L   AST 20 0 - 37 U/L   ALT 33 0 - 53 U/L   Total Protein 7.5 6.0 - 8.3 g/dL   Albumin 4.5 3.5 - 5.2 g/dL   GFR 04.54 >09.81 mL/min   Calcium 9.4 8.4 - 10.5 mg/dL  Lipid panel  Result Value Ref Range   Cholesterol 210 (H) 0 - 200 mg/dL   Triglycerides 191.4 0.0 - 149.0 mg/dL   HDL 78.29 >56.21 mg/dL   VLDL 30.8 0.0 - 65.7 mg/dL   LDL Cholesterol 846 (H) 0 - 99 mg/dL   Total CHOL/HDL Ratio 5    NonHDL 165.48     Assessment & Plan:   Problem List Items Addressed This Visit     Migraines    Stable period on current regimen - continue.       Relevant Medications   amLODipine (NORVASC) 10 MG  tablet   atorvastatin (LIPITOR) 20 MG tablet   metoprolol succinate (TOPROL-XL) 25 MG 24 hr tablet   Encounter for general adult medical examination with abnormal findings - Primary (Chronic)    Preventative protocols reviewed and updated unless pt declined. Discussed healthy diet and lifestyle.       Family history of early CAD    Continue statin.       White coat syndrome with diagnosis of hypertension    Chronic, elevated today - anticipate component of white coat hypertension  Home readings largely well controlled.  No changes made.       Relevant Medications   amLODipine (NORVASC) 10 MG tablet   atorvastatin (LIPITOR) 20 MG tablet   metoprolol succinate (TOPROL-XL) 25 MG 24 hr tablet   Dyslipidemia    Strong fmhx premature CAD, personal elevated Lp(a) 156 (2022) - continue atorvastatin 20mg  MWF.  The 10-year ASCVD risk score (Arnett DK, et al., 2019) is: 3.3%   Values used to calculate the score:     Age: 41 years     Sex: Male     Is Non-Hispanic African American: No     Diabetic: No     Tobacco smoker: No     Systolic Blood Pressure: 144 mmHg     Is BP treated: Yes     HDL Cholesterol: 44.8 mg/dL     Total Cholesterol: 210 mg/dL  Relevant Medications   atorvastatin (LIPITOR) 20 MG tablet   Other Relevant Orders   Lipid panel   Comprehensive metabolic panel   Obesity, Class I, BMI 30-34.9   High serum lipoprotein(a)    Lp(a) 153 (2022) - continue titration of atorvastatin.       Hyperglycemia    Update A1c.       Relevant Orders   Hemoglobin A1c   Abnormal tympanic membrane of both ears    Some maceration to both TMs - he regularly cleans ears. Recommend stop home cerumen removal. Rx vosol acetic acid ear drops to cover possible developing external otitis.         Meds ordered this encounter  Medications   amLODipine (NORVASC) 10 MG tablet    Sig: Take 1 tablet (10 mg total) by mouth daily.    Dispense:  90 tablet    Refill:  4    atorvastatin (LIPITOR) 20 MG tablet    Sig: Take 1 tablet (20 mg total) by mouth every Monday, Wednesday, and Friday for 1 dose.    Dispense:  40 tablet    Refill:  4   metoprolol succinate (TOPROL-XL) 25 MG 24 hr tablet    Sig: Take 1 tablet (25 mg total) by mouth daily.    Dispense:  90 tablet    Refill:  4   co-enzyme Q-10 30 MG capsule    Sig: Take 1 capsule (30 mg total) by mouth every Monday, Wednesday, and Friday.   acetic acid 2 % otic solution    Sig: Place 4 drops into both ears 4 (four) times daily. For 5 days    Dispense:  15 mL    Refill:  0    Orders Placed This Encounter  Procedures   Lipid panel   Comprehensive metabolic panel   Hemoglobin A1c    Patient Instructions  Fasting labs today  Ear canal/drums look irritated - back off ear cleaning at home.  BP staying overall great at home - continue current medicines Return as needed or in 1 year for next physical. Bring in BP cuff to that visit   Follow up plan: Return in about 1 year (around 06/17/2023) for annual exam, prior fasting for blood work.  Eustaquio Boyden, MD

## 2022-06-17 NOTE — Assessment & Plan Note (Signed)
Chronic, elevated today - anticipate component of white coat hypertension  Home readings largely well controlled.  No changes made.

## 2022-06-17 NOTE — Assessment & Plan Note (Addendum)
Continue statin. 

## 2022-06-17 NOTE — Assessment & Plan Note (Addendum)
Stable period on current regimen - continue.  

## 2022-06-17 NOTE — Assessment & Plan Note (Addendum)
Lp(a) 153 (2022) - continue titration of atorvastatin.

## 2022-06-17 NOTE — Assessment & Plan Note (Signed)
Strong fmhx premature CAD, personal elevated Lp(a) 156 (2022) - continue atorvastatin 20mg  MWF.  The 10-year ASCVD risk score (Arnett DK, et al., 2019) is: 3.3%   Values used to calculate the score:     Age: 42 years     Sex: Male     Is Non-Hispanic African American: No     Diabetic: No     Tobacco smoker: No     Systolic Blood Pressure: 144 mmHg     Is BP treated: Yes     HDL Cholesterol: 44.8 mg/dL     Total Cholesterol: 210 mg/dL

## 2022-06-17 NOTE — Assessment & Plan Note (Signed)
Some maceration to both TMs - he regularly cleans ears. Recommend stop home cerumen removal. Rx vosol acetic acid ear drops to cover possible developing external otitis.

## 2022-06-28 ENCOUNTER — Encounter: Payer: Self-pay | Admitting: Family Medicine

## 2022-06-28 DIAGNOSIS — Z3009 Encounter for other general counseling and advice on contraception: Secondary | ICD-10-CM

## 2022-07-19 ENCOUNTER — Encounter: Payer: Self-pay | Admitting: Urology

## 2022-07-19 ENCOUNTER — Ambulatory Visit: Payer: BC Managed Care – PPO | Admitting: Urology

## 2022-07-19 VITALS — BP 163/113 | HR 98 | Ht 72.0 in | Wt 242.8 lb

## 2022-07-19 DIAGNOSIS — Z3009 Encounter for other general counseling and advice on contraception: Secondary | ICD-10-CM

## 2022-07-19 MED ORDER — DIAZEPAM 5 MG PO TABS: 5.0000 mg | ORAL_TABLET | Freq: Once | ORAL | 0 refills | Status: DC | PRN

## 2022-07-19 NOTE — Patient Instructions (Signed)
Pre-Vasectomy Instructions  STOP all aspirin or blood thinners (Aspirin, Plavix, Coumadin, Warfarin, Motrin, Ibuprofen, Advil, Aleve, Naproxen, Naprosyn) for 7 days prior to the procedure.  If you have any questions about stopping these medications please contact your primary care physician or cardiologist.  Shave all hair from the upper scrotum on the day of the procedure.  This means just under the penis onto the scrotal sac.  The area shaved should measure about 2-3 inches around.  You may lather the scrotum with soap and water, and shave with a safety razor.  After shaving the area, thoroughly wash the penis and the scrotum, then shower or bathe to remove all the loose hairs.  If needed, wash the area again just before coming in for your Vasectomy.  It is recommended to have a light meal an hour or so prior to the procedure.  Bring a scrotal support (jock strap or suspensory, or tight jockey shorts or underwear).  Wear comfortable pants or shorts.  While the actual procedure usually takes about 45 minutes, you should be prepared to stay in the office for approximately one hour.  Bring someone with you to drive you home.  If you have any questions or concerns, please feel free to call the office at [phone].  Vasectomy Vasectomy is a procedure in which the vas deferens is cut and then tied or burned (cauterized). The vas deferens is a tube that carries sperm from the testicle to the part of the body that drains urine from the bladder (urethra). This procedure blocks sperm from going through the vas deferens and penis during ejaculation. This ensures that sperm does not go into the vagina during sex. Vasectomy does not affect sexual desire or performance and does not prevent sexually transmitted infections. Vasectomy is considered a permanent and very effective form of birth control (contraception). The decision to have a vasectomy should not be made during a stressful time, such as after  the loss of a pregnancy or a divorce. You and your partner should decide on whether to have a vasectomy when you are sure that you do not want children in the future. Tell a health care provider about: Any allergies you have. All medicines you are taking, including vitamins, herbs, eye drops, creams, and over-the-counter medicines. Any problems you or family members have had with anesthetic medicines. Any blood disorders you have. Any surgeries you have had. Any medical conditions you have. What are the risks? Generally, this is a safe procedure. However, problems may occur, including: Infection. Bleeding and swelling of the scrotum. The scrotum is the sac that contains the testicles, blood vessels, and structures that help deliver sperm and semen. Allergic reactions to medicines. Failure of the procedure to prevent pregnancy. There is a very small chance that the tied or cauterized ends of the vas deferens may reconnect (recanalization). If this happens, you could still make a woman pregnant. Pain in the scrotum that continues after you heal from the procedure. What happens before the procedure? Medicines Ask your health care provider about: Changing or stopping your regular medicines. This is especially important if you are taking diabetes medicines or blood thinners. Taking medicines such as aspirin and ibuprofen. These medicines can thin your blood. Do not take these medicines unless your health care provider tells you to take them. Taking over-the-counter medicines, vitamins, herbs, and supplements. You may be told to take a medicine to help you relax (sedative) a few hours before the procedure. General instructions Do not  use any products that contain nicotine or tobacco for at least 4 weeks before the procedure. These products include cigarettes, e-cigarettes, and chewing tobacco. If you need help quitting, ask your health care provider. Plan to have a responsible adult take you home  from the hospital or clinic. If you will be going home right after the procedure, plan to have a responsible adult care for you for the time you are told. This is important. Ask your health care provider: How your surgery site will be marked. What steps will be taken to help prevent infection. These steps may include: Removing hair at the surgery site. Washing skin with a germ-killing soap. Taking antibiotic medicine. What happens during the procedure?  You will be given one or more of the following: A sedative, unless you were told to take this a few hours before the procedure. A medicine to numb the area (local anesthetic). Your health care provider will feel, or palpate, for your vas deferens. To reach the vas deferens, one of two methods may be used: A very small incision may be made in your scrotum. A punctured opening may be made in your scrotum, without an incision. Your vas deferens will be pulled out of your scrotum and cut. Then, the vas deferens will be closed in one of two ways: Tied at the ends. Cauterized at the ends to seal them off. The vas deferens will be put back into your scrotum. The incision or puncture opening will be closed with absorbable stitches (sutures). The sutures will eventually dissolve and will not need to be removed after the procedure. The procedure will be repeated on the other side of your scrotum. The procedure may vary among health care providers and hospitals. What happens after the procedure? You will be monitored to make sure that you do not have problems. You will be asked not to ejaculate for at least 1 week after the procedure, or for as long as you are told. You will need to use a different form of contraception for 2-4 months after the procedure, until you have test results confirming that there are no sperm in your semen. You may be given scrotal support to wear, such as a jockstrap or underwear with a supportive pouch. If you were given a  sedative during the procedure, it can affect you for several hours. Do not drive or operate machinery until your health care provider says that it is safe. Summary Vasectomy blocks sperm from being released during ejaculation. This procedure is considered a permanent and very effective form of birth control. Your scrotum will be numbed with medicine (local anesthetic) for the procedure. After the procedure, you will be asked not to ejaculate for at least 1 week, or for as long as you are told. You will also need to use a different form of contraception until your test results confirm that there are no sperm in your semen. This information is not intended to replace advice given to you by your health care provider. Make sure you discuss any questions you have with your health care provider. Document Revised: 06/06/2019 Document Reviewed: 06/06/2019 Elsevier Patient Education  2024 [employer].

## 2022-07-19 NOTE — Progress Notes (Signed)
07/19/22 3:12 PM   [name] Feb 24, 1980 [id number]  CC: Discuss vasectomy  HPI: 42 year old male with 3 children, he and his partner do not desire any further biologic pregnancies.  No family history of prostate cancer, he denies any urinary symptoms   PMH: Past Medical History:  Diagnosis Date   Back pain    Essential hypertension    History of chicken pox    History of hepatitis A 1993   History of high blood pressure    readings- no dx of HTN   Left knee pain    Marijuana smoker 09/18/2010   Migraines    Panic attack    worse mid 2012   Seasonal allergies     Family History: Family History  Problem Relation Age of Onset   Coronary artery disease Mother 93       CAD/MI, smoker   Stroke Mother 57   Hypertension Mother    Osteoarthritis Mother    Polycystic kidney disease Mother        on dialysis   Arthritis Maternal Grandmother    Kidney disease Maternal Grandfather    Cancer Other        great grandmother with lung and breast cancer   Hypertension Sister    Diabetes Neg Hx     Social History:  reports that he has never smoked. He has never used smokeless tobacco. He reports current alcohol use. He reports current drug use.  Physical Exam: BP (!) 163/113 (BP Location: Left Arm, Patient Position: Sitting, Cuff Size: Large)   Pulse 98   Ht 6' (1.829 m)   Wt 242 lb 12.8 oz (110.1 kg)   BMI 32.93 kg/m    Constitutional:  Alert and oriented, No acute distress. Cardiovascular: No clubbing, cyanosis, or edema. Respiratory: Normal respiratory effort, no increased work of breathing. GI: Abdomen is soft, nontender, nondistended, no abdominal masses GU: Deferens easily palpable bilaterally, testicles 20 cc and descended bilaterally without masses   Assessment & Plan:   42 year old male interested in vasectomy for permanent sterilization.  We discussed the risks and benefits of vasectomy at length.  Vasectomy is intended to be a permanent form of  contraception, and does not produce immediate sterility.  Following vasectomy another form of contraception is required until vas occlusion is confirmed by a post-vasectomy semen analysis obtained 2-3 months after the procedure.  Even after vas occlusion is confirmed, vasectomy is not 100% reliable in preventing pregnancy, and the failure rate is approximately 01/1998.  Repeat vasectomy is required in less than 1% of patients.  He should refrain from ejaculation for 1 week after vasectomy.  Options for fertility after vasectomy include vasectomy reversal, and sperm retrieval with in vitro fertilization or ICSI.  These options are not always successful and may be expensive.  Finally, there are other permanent and non-permanent alternatives to vasectomy available. There is no risk of erectile dysfunction, and the volume of semen will be similar to prior, as the majority of the ejaculate is from the prostate and seminal vesicles.   The procedure takes ~20 minutes.  We recommend patients take 5-10 mg of Valium 30 minutes prior, and he will need a driver post-procedure.  Local anesthetic is injected into the scrotal skin and a small segment of the vas deferens is removed, and the ends occluded. The complication rate is approximately 1-2%, and includes bleeding, infection, and development of chronic scrotal pain.  PLAN: Schedule vasectomy Valium sent to pharmacy   Legrand Rams,  MD 07/19/2022  [facility] [address] [place], [place] [address] [phone]

## 2022-08-24 ENCOUNTER — Ambulatory Visit (INDEPENDENT_AMBULATORY_CARE_PROVIDER_SITE_OTHER): Payer: BC Managed Care – PPO | Admitting: Urology

## 2022-08-24 ENCOUNTER — Encounter: Payer: Self-pay | Admitting: Urology

## 2022-08-24 VITALS — BP 148/90 | HR 93 | Ht 72.0 in | Wt 242.0 lb

## 2022-08-24 DIAGNOSIS — Z9852 Vasectomy status: Secondary | ICD-10-CM

## 2022-08-24 DIAGNOSIS — Z302 Encounter for sterilization: Secondary | ICD-10-CM | POA: Diagnosis not present

## 2022-08-24 NOTE — Patient Instructions (Signed)

## 2022-08-24 NOTE — Progress Notes (Signed)
VASECTOMY PROCEDURE NOTE:  The patient was taken to the minor procedure room and placed in the supine position. His genitals were prepped and draped in the usual sterile fashion. The right vas deferens was brought up to the skin of the right upper scrotum. The skin overlying it was anesthetized with 1% lidocaine without epinephrine, anesthetic was also injected alongside the vas deferens in the direction of the inguinal canal. The no scalpel vasectomy instrument was used to make a small perforation in the scrotal skin. The vasectomy clamp was used to grasp the vas deferens. It was carefully dissected free from surrounding structures. A 1cm segment of the vas was removed, and the cut ends of the mucosa were cauterized. No significant bleeding was noted. The vas deferens was returned to the scrotum. The skin incision was closed with a simple interrupted stitch of 4-0 chromic.  Attention was then turned to the left side. The left vasectomy was performed in the same exact fashion. Sterile dressings were placed over each incision. The patient tolerated the procedure well.  IMPRESSION/DIAGNOSIS: The patient is a 42 year old gentleman who underwent a vasectomy today. Post-procedure instructions were reviewed. I stressed the importance of continuing to use birth control until he provides a semen specimen more than 2 months from now that demonstrates azoospermia.  We discussed return precautions including fever over 101, significant bleeding or hematoma, or uncontrolled pain. I also stressed the importance of avoiding strenuous activity for one week, no sexual activity or ejaculations for 5 days, intermittent icing over the next 48 hours, and scrotal support.   PLAN: The patient will be advised of his semen analysis results when available.  Legrand Rams, MD 08/24/2022

## 2022-09-14 ENCOUNTER — Other Ambulatory Visit: Payer: Self-pay | Admitting: Family Medicine

## 2022-09-14 NOTE — Telephone Encounter (Signed)
Too soon. Rx sent 06/17/22, #40/4 to [facility] (see 06/17/22 OV note).   Request denied.

## 2022-11-24 ENCOUNTER — Other Ambulatory Visit: Payer: BC Managed Care – PPO

## 2022-11-24 DIAGNOSIS — Z9852 Vasectomy status: Secondary | ICD-10-CM

## 2022-11-25 LAB — POST-VAS SPERM EVALUATION,QUAL: Volume: 1.1 mL

## 2023-01-06 ENCOUNTER — Other Ambulatory Visit: Payer: Self-pay | Admitting: Family Medicine

## 2023-01-06 DIAGNOSIS — I10 Essential (primary) hypertension: Secondary | ICD-10-CM

## 2023-01-06 NOTE — Telephone Encounter (Signed)
Rx sent 06/17/22, #90/4 to [facility].   Request denied.

## 2023-01-13 ENCOUNTER — Telehealth: Payer: Self-pay | Admitting: Family Medicine

## 2023-01-13 ENCOUNTER — Other Ambulatory Visit: Payer: Self-pay | Admitting: Family Medicine

## 2023-01-13 DIAGNOSIS — I10 Essential (primary) hypertension: Secondary | ICD-10-CM

## 2023-01-13 NOTE — Telephone Encounter (Signed)
Prescription Request  01/13/2023  LOV: 06/17/2022  What is the name of the medication or equipment? metoprolol succinate (TOPROL-XL) 25 MG 24 hr tablet   Have you contacted your pharmacy to request a refill? Yes   Which pharmacy would you like this sent to?  [id number], [place] - [address] [place] [address] Phone: [phone] Fax: [phone]    Patient notified that their request is being sent to the clinical staff for review and that they should receive a response within 2 business days.   Please advise at [facility] [phone]

## 2023-01-13 NOTE — Telephone Encounter (Signed)
Duplicate request (see 01/13/23 refill note).

## 2023-01-13 NOTE — Telephone Encounter (Signed)
 Rx sent 06/17/22, #90/4 to [facility].   Request denied.

## 2023-01-14 ENCOUNTER — Other Ambulatory Visit: Payer: Self-pay | Admitting: Family Medicine

## 2023-01-14 DIAGNOSIS — I10 Essential (primary) hypertension: Secondary | ICD-10-CM

## 2023-01-16 NOTE — Telephone Encounter (Signed)
Spoke with [facility] about request (see 01/13/23 refill note).  Request denied.

## 2023-01-16 NOTE — Telephone Encounter (Signed)
Spoke with Fayrene Fearing, [facility], asking about 06/17/22 rx. States pt has multiple rxs on file but he does see 06/17/22 rx and will fill for pt.   Spoke with pt relaying [facility] from [facility]. Pt verbalizes understanding and expresses his thanks.

## 2023-01-16 NOTE — Telephone Encounter (Signed)
Patient called in stating that pharmacy is saying that he have no  more refills left on this medication,even when its showing that he should have 4.

## 2023-06-13 ENCOUNTER — Other Ambulatory Visit: Payer: BC Managed Care – PPO

## 2023-06-20 ENCOUNTER — Encounter: Payer: BC Managed Care – PPO | Admitting: Family Medicine

## 2023-06-26 ENCOUNTER — Other Ambulatory Visit: Payer: Self-pay | Admitting: Family Medicine

## 2023-06-26 DIAGNOSIS — E785 Hyperlipidemia, unspecified: Secondary | ICD-10-CM

## 2023-06-26 DIAGNOSIS — R739 Hyperglycemia, unspecified: Secondary | ICD-10-CM

## 2023-06-28 ENCOUNTER — Other Ambulatory Visit (INDEPENDENT_AMBULATORY_CARE_PROVIDER_SITE_OTHER)

## 2023-06-28 ENCOUNTER — Ambulatory Visit: Payer: Self-pay | Admitting: Family Medicine

## 2023-06-28 DIAGNOSIS — R739 Hyperglycemia, unspecified: Secondary | ICD-10-CM | POA: Diagnosis not present

## 2023-06-28 DIAGNOSIS — E785 Hyperlipidemia, unspecified: Secondary | ICD-10-CM | POA: Diagnosis not present

## 2023-06-28 LAB — COMPREHENSIVE METABOLIC PANEL WITH GFR
ALT: 39 U/L (ref 0–53)
AST: 24 U/L (ref 0–37)
Albumin: 4.7 g/dL (ref 3.5–5.2)
Alkaline Phosphatase: 77 U/L (ref 39–117)
BUN: 10 mg/dL (ref 6–23)
CO2: 29 meq/L (ref 19–32)
Calcium: 9.8 mg/dL (ref 8.4–10.5)
Chloride: 103 meq/L (ref 96–112)
Creatinine, Ser: 0.83 mg/dL (ref 0.40–1.50)
GFR: 105.83 mL/min (ref >60.00)
Glucose, Bld: 120 mg/dL — ABNORMAL HIGH (ref 70–99)
Potassium: 4.2 meq/L (ref 3.5–5.1)
Sodium: 140 meq/L (ref 135–145)
Total Bilirubin: 0.7 mg/dL (ref 0.2–1.2)
Total Protein: 7.2 g/dL (ref 6.0–8.3)

## 2023-06-28 LAB — LIPID PANEL
Cholesterol: 158 mg/dL (ref 0–200)
HDL: 46.6 mg/dL (ref >39.00)
LDL Cholesterol: 88 mg/dL (ref 0–99)
NonHDL: 111.13
Total CHOL/HDL Ratio: 3
Triglycerides: 116 mg/dL (ref 0.0–149.0)
VLDL: 23.2 mg/dL (ref 0.0–40.0)

## 2023-06-28 LAB — HEMOGLOBIN A1C: Hgb A1c MFr Bld: 5.7 % (ref 4.6–6.5)

## 2023-07-05 ENCOUNTER — Ambulatory Visit (INDEPENDENT_AMBULATORY_CARE_PROVIDER_SITE_OTHER): Admitting: Family Medicine

## 2023-07-05 ENCOUNTER — Encounter: Payer: Self-pay | Admitting: Family Medicine

## 2023-07-05 VITALS — BP 160/92 | HR 100 | Temp 98.6°F | Ht 71.75 in | Wt 246.4 lb

## 2023-07-05 DIAGNOSIS — R7303 Prediabetes: Secondary | ICD-10-CM

## 2023-07-05 DIAGNOSIS — Z0000 Encounter for general adult medical examination without abnormal findings: Secondary | ICD-10-CM | POA: Diagnosis not present

## 2023-07-05 DIAGNOSIS — Z8249 Family history of ischemic heart disease and other diseases of the circulatory system: Secondary | ICD-10-CM | POA: Diagnosis not present

## 2023-07-05 DIAGNOSIS — E785 Hyperlipidemia, unspecified: Secondary | ICD-10-CM

## 2023-07-05 DIAGNOSIS — G43809 Other migraine, not intractable, without status migrainosus: Secondary | ICD-10-CM

## 2023-07-05 DIAGNOSIS — R000 Tachycardia, unspecified: Secondary | ICD-10-CM

## 2023-07-05 DIAGNOSIS — Z1211 Encounter for screening for malignant neoplasm of colon: Secondary | ICD-10-CM | POA: Diagnosis not present

## 2023-07-05 DIAGNOSIS — I10 Essential (primary) hypertension: Secondary | ICD-10-CM

## 2023-07-05 DIAGNOSIS — E66811 Obesity, class 1: Secondary | ICD-10-CM

## 2023-07-05 MED ORDER — METOPROLOL SUCCINATE ER 25 MG PO TB24: 25.0000 mg | ORAL_TABLET | Freq: Every day | ORAL | 3 refills | Status: AC

## 2023-07-05 MED ORDER — ATORVASTATIN CALCIUM 20 MG PO TABS: 20.0000 mg | ORAL_TABLET | ORAL | 3 refills | Status: AC

## 2023-07-05 MED ORDER — BENAZEPRIL HCL 10 MG PO TABS: 10.0000 mg | ORAL_TABLET | Freq: Every day | ORAL | 6 refills | Status: DC

## 2023-07-05 MED ORDER — AMLODIPINE BESYLATE 10 MG PO TABS: 10.0000 mg | ORAL_TABLET | Freq: Every day | ORAL | 3 refills | Status: AC

## 2023-07-05 MED ORDER — BENAZEPRIL HCL 10 MG PO TABS: 10.0000 mg | ORAL_TABLET | Freq: Every day | ORAL | 1 refills | Status: AC

## 2023-07-05 NOTE — Assessment & Plan Note (Signed)
 Continue to encourage healthy diet and lifestyle choices to affect sustainable weight loss.

## 2023-07-05 NOTE — Patient Instructions (Addendum)
 BP was elevated today - continue torpolr XL 25mg  and amlodipine  10mg  daily, add benazepril 10mg  daily in the morning.  Return as needed or in 3 months for BP follow up.  We will refer you for colonoscopy in [facility]

## 2023-07-05 NOTE — Assessment & Plan Note (Signed)
 Pt aware to limit added sugar in diet

## 2023-07-05 NOTE — Assessment & Plan Note (Addendum)
 Preventative protocols reviewed and updated unless pt declined. Discussed healthy diet and lifestyle.  Refer for colonoscopy per pt request.

## 2023-07-05 NOTE — Assessment & Plan Note (Signed)
 Chronic, deteriorated in office as well as home readings - will continue amlodipine  and Toprol  XL, add benazepril 10mg  daily. Reviewed ACEI side effects/allergy to watch for. Check BMP in 1-2 wks after starting new medication.

## 2023-07-05 NOTE — Assessment & Plan Note (Signed)
Continue toprol-XL 

## 2023-07-05 NOTE — Assessment & Plan Note (Signed)
Stable period.  

## 2023-07-05 NOTE — Assessment & Plan Note (Addendum)
 Chronic, stable on atorvastatin  with Lp(a) elevated to 150s (2022) - continue statin. The 10-year ASCVD risk score (Arnett DK, et al., 2019) is: 2.6%   Values used to calculate the score:     Age: 43 years     Sex: Male     Is Non-Hispanic African American: No     Diabetic: No     Tobacco smoker: No     Systolic Blood Pressure: 160 mmHg     Is BP treated: Yes     HDL Cholesterol: 46.6 mg/dL     Total Cholesterol: 158 mg/dL

## 2023-07-05 NOTE — Progress Notes (Signed)
 Ph: [phone] Fax: [phone]   Patient ID: Derrick Santiago, male    DOB: 07-28-1980, 43 y.o.   MRN: [id number]  This visit was conducted in person.  BP (!) 160/92   Pulse 100   Temp 98.6 F (37 C) (Oral)   Ht 5' 11.75" (1.822 m)   Wt 246 lb 6 oz (111.8 kg)   SpO2 99%   BMI 33.65 kg/m   BP Readings from Last 3 Encounters:  07/05/23 (!) 160/92  08/24/22 (!) 148/90  07/19/22 (!) 163/113  164/94 on repeat testing  CC: CPE Subjective:   HPI: Derrick Santiago is a 43 y.o. 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  Walks regularly at lunch as well as bike ride daily for aerobic exercise.  Just started intermittent fasting 16:8.   BP elevated today - anticipate component of white coat hypertension.  Home BP reading was elevated this morning 144/92. Heart rate runs 70-80s at home as well. Regularly takes amlodipine  10mg  and Toprol  XL 25mg  daily in the mornings. Notes metoprolol  affects exercise - trouble reaching heart rate goal.   Migraines - no recent flares. 4-5 the whole year, manages with excedrin migraine followed by imitrex  if needed with benefit.    Caffeine - 1-2 cups coffee/day. Drinking lots of water.    HLD - he is on atorvastatin  20mg  MWF.  Continues hydroxyzine  PRN - infrequent use    Preventative: Colon cancer screening - discussed, requests colonoscopy referral  No fmhx prostate cancer Flu - yearly COVID vaccine - Pfizer 04/2019, 05/2019, Moderna booster 02/2020 Tdap: 10/2010, 05/2021  Seat belt use discussed.  Sunscreen use discussed. No changing moles on skin.  Sleep - averaging 7 hours/night  Non smoker  Alcohol - cut down significantly to 1-2 drinks occ.  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 Interim medical history since our last visit reviewed. Allergies and medications reviewed and updated. Outpatient Medications Prior to Visit  Medication Sig Dispense Refill   aspirin-acetaminophen-caffeine (EXCEDRIN MIGRAINE) 250-250-65 MG per tablet Take 2 tablets by mouth as needed.       co-enzyme Q-10 30 MG capsule Take 1 capsule (30 mg total) by mouth every Monday, Wednesday, and Friday.     hydrOXYzine  (ATARAX ) 25 MG tablet TAKE 1 TABLET BY MOUTH TWICE A DAY AS NEEDED FOR ANXIETY (SEDATION PRECAUTION) 180 tablet 1   Krill Oil 300 MG CAPS Take 1 capsule by mouth daily.     loratadine (CLARITIN) 10 MG tablet Take 10 mg by mouth daily as needed.     Multiple Vitamin (MULTIVITAMIN) tablet Take 1 tablet by mouth daily.     SUMAtriptan  (IMITREX ) 50 MG tablet TAKE 1 TABLET BY MOUTH DAILY AS NEEDED FOR MIGRAINE. MAY REPEAT IN 2 HRS IF NOT RELIEVED. 10 tablet 2   amLODipine  (NORVASC ) 10 MG tablet Take 1 tablet (10 mg total) by mouth daily. 90 tablet 4   atorvastatin  (LIPITOR) 20 MG tablet Take 1 tablet (20 mg total) by mouth every Monday, Wednesday, and  Friday for 1 dose. 40 tablet 4   metoprolol  succinate (TOPROL -XL) 25 MG 24 hr tablet Take 1 tablet (25 mg total) by mouth daily. 90 tablet 4   acetic acid  2 % otic solution Place 4 drops into both ears 4 (four) times daily. For 5 days (Patient not taking: Reported on 07/05/2023) 15 mL 0   No facility-administered medications prior to visit.     Per HPI unless specifically indicated in ROS section below Review of Systems  Constitutional:  Negative for activity change, appetite change, chills, fatigue, fever and unexpected weight change.  HENT:  Negative for hearing loss.   Eyes:  Negative for visual disturbance.  Respiratory:   Negative for cough, chest tightness, shortness of breath and wheezing.   Cardiovascular:  Negative for chest pain, palpitations and leg swelling.  Gastrointestinal:  Negative for abdominal distention, abdominal pain, blood in stool, constipation, diarrhea, nausea and vomiting.  Genitourinary:  Negative for difficulty urinating and hematuria.  Musculoskeletal:  Negative for arthralgias, myalgias and neck pain.  Skin:  Negative for rash.  Neurological:  Negative for dizziness, seizures, syncope and headaches.  Hematological:  Negative for adenopathy. Does not bruise/bleed easily.  Psychiatric/Behavioral:  Negative for dysphoric mood. The patient is not nervous/anxious.     Objective:  BP (!) 160/92   Pulse 100   Temp 98.6 F (37 C) (Oral)   Ht 5' 11.75" (1.822 m)   Wt 246 lb 6 oz (111.8 kg)   SpO2 99%   BMI 33.65 kg/m   Wt Readings from Last 3 Encounters:  07/05/23 246 lb 6 oz (111.8 kg)  08/24/22 242 lb (109.8 kg)  07/19/22 242 lb 12.8 oz (110.1 kg)      Physical Exam Vitals and nursing note reviewed.  Constitutional:      General: He is not in acute distress.    Appearance: Normal appearance. He is well-developed. He is not ill-appearing.  HENT:     Head: Normocephalic and atraumatic.     Right Ear: Hearing, tympanic membrane, ear canal and external ear normal.     Left Ear: Hearing, tympanic membrane, ear canal and external ear normal.     Mouth/Throat:     Mouth: Mucous membranes are moist.     Pharynx: Oropharynx is clear. No oropharyngeal exudate or posterior oropharyngeal erythema.  Eyes:     General: No scleral icterus.    Extraocular Movements: Extraocular movements intact.     Conjunctiva/sclera: Conjunctivae normal.     Pupils: Pupils are equal, round, and reactive to light.  Neck:     Thyroid : No thyroid  mass or thyromegaly.  Cardiovascular:     Rate and Rhythm: Normal rate and regular rhythm.     Pulses: Normal pulses.          Radial pulses are 2+ on the  right side and 2+ on the left side.     Heart sounds: Normal heart sounds. No murmur heard. Pulmonary:     Effort: Pulmonary effort is normal. No respiratory distress.     Breath sounds: Normal breath sounds. No wheezing, rhonchi or rales.  Abdominal:     General: Bowel sounds are normal. There is no distension.     Palpations: Abdomen is soft. There is no mass.     Tenderness: There is no abdominal tenderness. There is no guarding or rebound.     Hernia: No hernia is present.  Musculoskeletal:        General: Normal range of motion.  Cervical back: Normal range of motion and neck supple.     Right lower leg: No edema.     Left lower leg: No edema.  Lymphadenopathy:     Cervical: No cervical adenopathy.  Skin:    General: Skin is warm and dry.     Findings: No rash.  Neurological:     General: No focal deficit present.     Mental Status: He is alert and oriented to person, place, and time.  Psychiatric:        Mood and Affect: Mood normal.        Behavior: Behavior normal.        Thought Content: Thought content normal.        Judgment: Judgment normal.       Results for orders placed or performed in visit on 06/28/23  Hemoglobin A1c   Collection Time: 06/28/23  8:26 AM  Result Value Ref Range   Hgb A1c MFr Bld 5.7 4.6 - 6.5 %  Comprehensive metabolic panel with GFR   Collection Time: 06/28/23  8:26 AM  Result Value Ref Range   Sodium 140 135 - 145 mEq/L   Potassium 4.2 3.5 - 5.1 mEq/L   Chloride 103 96 - 112 mEq/L   CO2 29 19 - 32 mEq/L   Glucose, Bld 120 (H) 70 - 99 mg/dL   BUN 10 6 - 23 mg/dL   Creatinine, Ser 4.09 0.40 - 1.50 mg/dL   Total Bilirubin 0.7 0.2 - 1.2 mg/dL   Alkaline Phosphatase 77 39 - 117 U/L   AST 24 0 - 37 U/L   ALT 39 0 - 53 U/L   Total Protein 7.2 6.0 - 8.3 g/dL   Albumin 4.7 3.5 - 5.2 g/dL   GFR 811.91 >47.82 mL/min   Calcium  9.8 8.4 - 10.5 mg/dL  Lipid panel   Collection Time: 06/28/23  8:26 AM  Result Value Ref Range   Cholesterol  158 0 - 200 mg/dL   Triglycerides 956.2 0.0 - 149.0 mg/dL   HDL 13.08 >65.78 mg/dL   VLDL 46.9 0.0 - 62.9 mg/dL   LDL Cholesterol 88 0 - 99 mg/dL   Total CHOL/HDL Ratio 3    NonHDL 111.13     Assessment & Plan:   Problem List Items Addressed This Visit     Health maintenance examination - Primary (Chronic)   Preventative protocols reviewed and updated unless pt declined. Discussed healthy diet and lifestyle.  Refer for colonoscopy per pt request.       Migraines   Stable period      Relevant Medications   amLODipine  (NORVASC ) 10 MG tablet   atorvastatin  (LIPITOR) 20 MG tablet   metoprolol  succinate (TOPROL -XL) 25 MG 24 hr tablet   benazepril (LOTENSIN) 10 MG tablet   Family history of early CAD   White coat syndrome with diagnosis of hypertension   Chronic, deteriorated in office as well as home readings - will continue amlodipine  and Toprol  XL, add benazepril 10mg  daily. Reviewed ACEI side effects/allergy to watch for. Check BMP in 1-2 wks after starting new medication.       Relevant Medications   amLODipine  (NORVASC ) 10 MG tablet   atorvastatin  (LIPITOR) 20 MG tablet   metoprolol  succinate (TOPROL -XL) 25 MG 24 hr tablet   benazepril (LOTENSIN) 10 MG tablet   Dyslipidemia   Chronic, stable on atorvastatin  with Lp(a) elevated to 150s (2022) - continue statin. The 10-year ASCVD risk score (Arnett DK, et al., 2019) is: 2.6%  Values used to calculate the score:     Age: 1 years     Sex: Male     Is Non-Hispanic African American: No     Diabetic: No     Tobacco smoker: No     Systolic Blood Pressure: 160 mmHg     Is BP treated: Yes     HDL Cholesterol: 46.6 mg/dL     Total Cholesterol: 158 mg/dL       Relevant Medications   atorvastatin  (LIPITOR) 20 MG tablet   Obesity, Class I, BMI 30-34.9   Continue to encourage healthy diet and lifestyle choices to affect sustainable weight loss.       Tachycardia   Continue toprol  XL      Prediabetes   Pt aware to  limit added sugar in diet      Other Visit Diagnoses       Special screening for malignant neoplasms, colon       Relevant Orders   Ambulatory referral to Gastroenterology     Essential hypertension       Relevant Medications   amLODipine  (NORVASC ) 10 MG tablet   atorvastatin  (LIPITOR) 20 MG tablet   metoprolol  succinate (TOPROL -XL) 25 MG 24 hr tablet   benazepril (LOTENSIN) 10 MG tablet   Other Relevant Orders   Basic Metabolic Panel        Meds ordered this encounter  Medications   DISCONTD: benazepril (LOTENSIN) 10 MG tablet    Sig: Take 1 tablet (10 mg total) by mouth daily.    Dispense:  30 tablet    Refill:  6   amLODipine  (NORVASC ) 10 MG tablet    Sig: Take 1 tablet (10 mg total) by mouth daily.    Dispense:  90 tablet    Refill:  3   atorvastatin  (LIPITOR) 20 MG tablet    Sig: Take 1 tablet (20 mg total) by mouth every Monday, Wednesday, and Friday for 1 dose.    Dispense:  40 tablet    Refill:  3   metoprolol  succinate (TOPROL -XL) 25 MG 24 hr tablet    Sig: Take 1 tablet (25 mg total) by mouth daily.    Dispense:  90 tablet    Refill:  3   benazepril (LOTENSIN) 10 MG tablet    Sig: Take 1 tablet (10 mg total) by mouth daily.    Dispense:  90 tablet    Refill:  1    Orders Placed This Encounter  Procedures   Basic Metabolic Panel    Standing Status:   Future    Expiration Date:   07/04/2024   Ambulatory referral to Gastroenterology    Referral Priority:   Routine    Referral Type:   Consultation    Referral Reason:   Specialty Services Required    Number of Visits Requested:   1    Patient Instructions  BP was elevated today - continue torpolr XL 25mg  and amlodipine  10mg  daily, add benazepril 10mg  daily in the morning.  Return as needed or in 3 months for BP follow up.  We will refer you for colonoscopy in [facility]  Follow up plan: Return in about 3 months (around 10/05/2023) for follow up visit.  Claire Crick, MD

## 2023-07-19 ENCOUNTER — Other Ambulatory Visit (INDEPENDENT_AMBULATORY_CARE_PROVIDER_SITE_OTHER)

## 2023-07-19 DIAGNOSIS — I10 Essential (primary) hypertension: Secondary | ICD-10-CM

## 2023-07-19 LAB — BASIC METABOLIC PANEL WITH GFR
BUN: 11 mg/dL (ref 6–23)
CO2: 27 meq/L (ref 19–32)
Calcium: 9.3 mg/dL (ref 8.4–10.5)
Chloride: 104 meq/L (ref 96–112)
Creatinine, Ser: 0.82 mg/dL (ref 0.40–1.50)
GFR: 106.18 mL/min (ref >60.00)
Glucose, Bld: 162 mg/dL — ABNORMAL HIGH (ref 70–99)
Potassium: 4.1 meq/L (ref 3.5–5.1)
Sodium: 137 meq/L (ref 135–145)

## 2023-07-20 ENCOUNTER — Ambulatory Visit: Payer: Self-pay | Admitting: Family Medicine

## 2023-07-31 ENCOUNTER — Encounter: Payer: Self-pay | Admitting: Gastroenterology

## 2023-07-31 ENCOUNTER — Other Ambulatory Visit: Payer: Self-pay

## 2023-07-31 ENCOUNTER — Ambulatory Visit (AMBULATORY_SURGERY_CENTER)

## 2023-07-31 VITALS — Ht 72.0 in | Wt 235.0 lb

## 2023-07-31 DIAGNOSIS — Z1211 Encounter for screening for malignant neoplasm of colon: Secondary | ICD-10-CM

## 2023-07-31 MED ORDER — NA SULFATE-K SULFATE-MG SULF 17.5-3.13-1.6 GM/177ML PO SOLN: 1.0000 | Freq: Once | ORAL | 0 refills | Status: AC

## 2023-07-31 NOTE — Progress Notes (Signed)
 Denies allergies to eggs or soy products. Denies complication of anesthesia or sedation. Denies use of weight loss medication. Denies use of O2.   Emmi instructions given for colonoscopy.

## 2023-08-01 HISTORY — PX: COLONOSCOPY: SHX174

## 2023-08-18 ENCOUNTER — Encounter: Payer: Self-pay | Admitting: Gastroenterology

## 2023-08-18 ENCOUNTER — Ambulatory Visit: Admitting: Gastroenterology

## 2023-08-18 VITALS — BP 121/88 | HR 87 | Temp 98.4°F | Resp 15 | Ht 71.0 in | Wt 235.0 lb

## 2023-08-18 DIAGNOSIS — K640 First degree hemorrhoids: Secondary | ICD-10-CM | POA: Diagnosis not present

## 2023-08-18 DIAGNOSIS — D127 Benign neoplasm of rectosigmoid junction: Secondary | ICD-10-CM | POA: Diagnosis not present

## 2023-08-18 DIAGNOSIS — D123 Benign neoplasm of transverse colon: Secondary | ICD-10-CM | POA: Diagnosis not present

## 2023-08-18 DIAGNOSIS — Z1211 Encounter for screening for malignant neoplasm of colon: Secondary | ICD-10-CM

## 2023-08-18 DIAGNOSIS — K644 Residual hemorrhoidal skin tags: Secondary | ICD-10-CM | POA: Diagnosis not present

## 2023-08-18 DIAGNOSIS — K5730 Diverticulosis of large intestine without perforation or abscess without bleeding: Secondary | ICD-10-CM | POA: Diagnosis not present

## 2023-08-18 MED ORDER — SODIUM CHLORIDE 0.9 % IV SOLN: 500.0000 mL | Freq: Once | INTRAVENOUS | Status: DC

## 2023-08-18 NOTE — Progress Notes (Signed)
 Vitals-DT  Pt's states no medical or surgical changes since previsit or office visit.

## 2023-08-18 NOTE — Progress Notes (Signed)
 Called to room to assist during endoscopic procedure.  Patient ID and intended procedure confirmed with present staff. Received instructions for my participation in the procedure from the performing physician.

## 2023-08-18 NOTE — Progress Notes (Signed)
 Sedate, gd SR, tolerated procedure well, VSS, report to RN

## 2023-08-18 NOTE — Op Note (Signed)
 [facility] Patient Name: Derrick Santiago Procedure Date: 08/18/2023 11:17 AM MRN: [id number] Endoscopist: Aloha Finner , MD, [id number] Age: 43 Referring MD:  Date of Birth: Oct 05, 1980 Gender: Male Account #: [id number] Procedure:                Colonoscopy Indications:              Screening for colorectal malignant neoplasm, This                            is the patient's first colonoscopy Medicines:                Monitored Anesthesia Care Procedure:                Pre-Anesthesia Assessment:                           - Prior to the procedure, a History and Physical                            was performed, and patient medications and                            allergies were reviewed. The patient's tolerance of                            previous anesthesia was also reviewed. The risks                            and benefits of the procedure and the sedation                            options and risks were discussed with the patient.                            All questions were answered, and informed consent                            was obtained. Prior Anticoagulants: The patient has                            taken no anticoagulant or antiplatelet agents. ASA                            Grade Assessment: II - A patient with mild systemic                            disease. After reviewing the risks and benefits,                            the patient was deemed in satisfactory condition to                            undergo the procedure.  After obtaining informed consent, the colonoscope                            was passed under direct vision. Throughout the                            procedure, the patient's blood pressure, pulse, and                            oxygen saturations were monitored continuously. The                            Olympus CF-HQ190L ([id number]) Colonoscope was                            introduced through  the anus and advanced to the 3                            cm into the ileum. The colonoscopy was performed                            without difficulty. The patient tolerated the                            procedure. The quality of the bowel preparation was                            adequate. The terminal ileum, ileocecal valve,                            appendiceal orifice, and rectum were photographed. Scope In: 11:23:19 AM Scope Out: 11:40:38 AM Scope Withdrawal Time: 0 hours 14 minutes 52 seconds  Total Procedure Duration: 0 hours 17 minutes 19 seconds  Findings:                 The digital rectal exam findings include                            hemorrhoids. Pertinent negatives include no                            palpable rectal lesions.                           A large amount of semi-liquid stool was found in                            the entire colon, interfering with visualization.                            Lavage of the area was performed using copious                            amounts, resulting in clearance with adequate  visualization.                           The terminal ileum and ileocecal valve appeared                            normal.                           Three sessile polyps were found in the                            recto-sigmoid colon (2) and transverse colon (1).                            The polyps were 3 to 10 mm in size. These polyps                            were removed with a cold snare. Resection and                            retrieval were complete.                           Multiple small-mouthed diverticula were found in                            the recto-sigmoid colon and sigmoid colon.                           Normal mucosa was found in the entire colon                            otherwise.                           Non-bleeding non-thrombosed external and internal                            hemorrhoids  were found during retroflexion, during                            perianal exam and during digital exam. The                            hemorrhoids were Grade I (internal hemorrhoids that                            do not prolapse). Complications:            No immediate complications. Estimated Blood Loss:     Estimated blood loss was minimal. Impression:               - Hemorrhoids found on digital rectal exam.                           - Stool in  the entire examined colon. Lavaged                            copiously with adequate visualization.                           - The examined portion of the ileum was normal.                           - Three, 3 to 10 mm polyps at the recto-sigmoid                            colon and in the transverse colon, removed with a                            cold snare. Resected and retrieved.                           - Diverticulosis in the recto-sigmoid colon and in                            the sigmoid colon.                           - Normal mucosa in the entire examined colon                            otherwise.                           - Non-bleeding non-thrombosed external and internal                            hemorrhoids. Recommendation:           - The patient will be observed post-procedure,                            until all discharge criteria are met.                           - Discharge patient to home.                           - Patient has a contact number available for                            emergencies. The signs and symptoms of potential                            delayed complications were discussed with the                            patient. Return to normal activities tomorrow.  Written discharge instructions were provided to the                            patient.                           - High fiber diet.                           - Use FiberCon 1-2 tablets PO daily.                            - Continue present medications.                           - Await pathology results.                           - Repeat colonoscopy in likely 3-5 years for                            surveillance based on pathology results.                           - The findings and recommendations were discussed                            with the patient.                           - The findings and recommendations were discussed                            with the patient's family. Aloha Finner, MD 08/18/2023 11:46:27 AM

## 2023-08-18 NOTE — Progress Notes (Signed)
 GASTROENTEROLOGY PROCEDURE H&P NOTE   Primary Care Physician: Rilla Baller, MD  HPI: Derrick Santiago is a 43 y.o. male who presents for Colonoscopy for screening.  Past Medical History:  Diagnosis Date   Allergy    Seasonal allergies, soy allergy   Back pain    Essential hypertension    History of chicken pox    History of hepatitis A 1993   History of high blood pressure    readings- no dx of HTN   Hyperlipidemia    Left knee pain    Marijuana smoker 09/18/2010   Migraines    Panic attack    worse mid 2012   Seasonal allergies    Past Surgical History:  Procedure Laterality Date   APPENDECTOMY  1993   Current Outpatient Medications  Medication Sig Dispense Refill   acetic acid  2 % otic solution Place 4 drops into both ears 4 (four) times daily. For 5 days (Patient not taking: Reported on 07/05/2023) 15 mL 0   amLODipine  (NORVASC ) 10 MG tablet Take 1 tablet (10 mg total) by mouth daily. 90 tablet 3   aspirin-acetaminophen-caffeine (EXCEDRIN MIGRAINE) 250-250-65 MG per tablet Take 2 tablets by mouth as needed.       atorvastatin  (LIPITOR) 20 MG tablet Take 1 tablet (20 mg total) by mouth every Monday, Wednesday, and Friday for 1 dose. 40 tablet 3   benazepril  (LOTENSIN ) 10 MG tablet Take 1 tablet (10 mg total) by mouth daily. 90 tablet 1   co-enzyme Q-10 30 MG capsule Take 1 capsule (30 mg total) by mouth every Monday, Wednesday, and Friday.     hydrOXYzine  (ATARAX ) 25 MG tablet TAKE 1 TABLET BY MOUTH TWICE A DAY AS NEEDED FOR ANXIETY (SEDATION PRECAUTION) 180 tablet 1   Krill Oil 300 MG CAPS Take 1 capsule by mouth daily.     loratadine (CLARITIN) 10 MG tablet Take 10 mg by mouth daily as needed.     metoprolol  succinate (TOPROL -XL) 25 MG 24 hr tablet Take 1 tablet (25 mg total) by mouth daily. 90 tablet 3   Multiple Vitamin (MULTIVITAMIN) tablet Take 1 tablet by mouth daily.     SUMAtriptan  (IMITREX ) 50 MG tablet TAKE 1 TABLET BY MOUTH DAILY AS NEEDED FOR  MIGRAINE. MAY REPEAT IN 2 HRS IF NOT RELIEVED. 10 tablet 2   Current Facility-Administered Medications  Medication Dose Route Frequency Provider Last Rate Last Admin   0.9 %  sodium chloride infusion  500 mL Intravenous Once Mansouraty, Harrold Fitchett Jr., MD        Current Outpatient Medications:    acetic acid  2 % otic solution, Place 4 drops into both ears 4 (four) times daily. For 5 days (Patient not taking: Reported on 07/05/2023), Disp: 15 mL, Rfl: 0   amLODipine  (NORVASC ) 10 MG tablet, Take 1 tablet (10 mg total) by mouth daily., Disp: 90 tablet, Rfl: 3   aspirin-acetaminophen-caffeine (EXCEDRIN MIGRAINE) 250-250-65 MG per tablet, Take 2 tablets by mouth as needed.  , Disp: , Rfl:    atorvastatin  (LIPITOR) 20 MG tablet, Take 1 tablet (20 mg total) by mouth every Monday, Wednesday, and Friday for 1 dose., Disp: 40 tablet, Rfl: 3   benazepril  (LOTENSIN ) 10 MG tablet, Take 1 tablet (10 mg total) by mouth daily., Disp: 90 tablet, Rfl: 1   co-enzyme Q-10 30 MG capsule, Take 1 capsule (30 mg total) by mouth every Monday, Wednesday, and Friday., Disp: , Rfl:    hydrOXYzine  (ATARAX ) 25 MG tablet, TAKE 1  TABLET BY MOUTH TWICE A DAY AS NEEDED FOR ANXIETY (SEDATION PRECAUTION), Disp: 180 tablet, Rfl: 1   Krill Oil 300 MG CAPS, Take 1 capsule by mouth daily., Disp: , Rfl:    loratadine (CLARITIN) 10 MG tablet, Take 10 mg by mouth daily as needed., Disp: , Rfl:    metoprolol  succinate (TOPROL -XL) 25 MG 24 hr tablet, Take 1 tablet (25 mg total) by mouth daily., Disp: 90 tablet, Rfl: 3   Multiple Vitamin (MULTIVITAMIN) tablet, Take 1 tablet by mouth daily., Disp: , Rfl:    SUMAtriptan  (IMITREX ) 50 MG tablet, TAKE 1 TABLET BY MOUTH DAILY AS NEEDED FOR MIGRAINE. MAY REPEAT IN 2 HRS IF NOT RELIEVED., Disp: 10 tablet, Rfl: 2  Current Facility-Administered Medications:    0.9 %  sodium chloride infusion, 500 mL, Intravenous, Once, Mansouraty, Aloha Raddle., MD Allergies  Allergen Reactions   Other Hives and  Swelling    Soy bean oil   Family History  Problem Relation Age of Onset   Coronary artery disease Mother 60       CAD/MI, smoker   Stroke Mother 55   Hypertension Mother    Osteoarthritis Mother    Polycystic kidney disease Mother        on dialysis   Hypertension Sister    Arthritis Maternal Grandmother    Kidney disease Maternal Grandfather    Cancer Other        great grandmother with lung and breast cancer   Diabetes Neg Hx    Colon cancer Neg Hx    Stomach cancer Neg Hx    Rectal cancer Neg Hx    Social History   Socioeconomic History   Marital status: Married    Spouse name: Not on file   Number of children: 2   Years of education: AA   Highest education level: Bachelor's degree (e.g., BA, AB, BS)  Occupational History   Occupation: Radio producer: Geographical information systems officer  Tobacco Use   Smoking status: Never    Passive exposure: Never   Smokeless tobacco: Never  Vaping Use   Vaping status: Never Used  Substance and Sexual Activity   Alcohol use: Yes    Alcohol/week: 4.0 standard drinks of alcohol    Types: 4 Cans of beer per week    Comment: Occasionally-rare   Drug use: Not Currently    Comment: MJ-occasional   Sexual activity: Yes    Birth control/protection: Other-see comments    Comment: Vasectomy  Other Topics Concern   Not on file  Social History Narrative   Caffeine: down to 2 cups/day   Lives with wife, 2 children (2007, 2010), 1 dog   Occu: Control and instrumentation engineer   Activity: walks, swims regularly   Diet: fruits and vegetables, water   Social Drivers of Corporate investment banker Strain: Low Risk  (07/04/2023)   Overall Financial Resource Strain (CARDIA)    Difficulty of Paying Living Expenses: Not hard at all  Food Insecurity: No Food Insecurity (07/04/2023)   Hunger Vital Sign    Worried About Running Out of Food in the Last Year: Never true    Ran Out of Food in the Last Year: Never true  Transportation Needs: No Transportation  Needs (07/04/2023)   PRAPARE - Administrator, Civil Service (Medical): No    Lack of Transportation (Non-Medical): No  Physical Activity: Sufficiently Active (07/04/2023)   Exercise Vital Sign    Days of Exercise per Week: 5  days    Minutes of Exercise per Session: 50 min  Stress: No Stress Concern Present (07/04/2023)   [employer] of Occupational Health - Occupational Stress Questionnaire    Feeling of Stress : Not at all  Social Connections: Unknown (07/04/2023)   Social Connection and Isolation Panel    Frequency of Communication with Friends and Family: Once a week    Frequency of Social Gatherings with Friends and Family: Once a week    Attends Religious Services: Patient declined    Database administrator or Organizations: Patient declined    Attends Banker Meetings: Not on file    Marital Status: Married  Intimate Partner Violence: Not on file    Physical Exam: Today's Vitals   08/18/23 1015  BP: (!) 116/97  Pulse: 100  Temp: 98.4 F (36.9 C)  TempSrc: Temporal  SpO2: 98%  Weight: 235 lb (106.6 kg)  Height: 5' 11 (1.803 m)  PainSc: 0-No pain   Body mass index is 32.78 kg/m. GEN: NAD EYE: Sclerae anicteric ENT: MMM CV: Non-tachycardic GI: Soft, NT/ND NEURO:  Alert & Oriented x 3  Lab Results: No results for input(s): WBC, HGB, HCT, PLT in the last 72 hours. BMET No results for input(s): NA, K, CL, CO2, GLUCOSE, BUN, CREATININE, CALCIUM  in the last 72 hours. LFT No results for input(s): PROT, ALBUMIN, AST, ALT, ALKPHOS, BILITOT, BILIDIR, IBILI in the last 72 hours. PT/INR No results for input(s): LABPROT, INR in the last 72 hours.   Impression / Plan: This is a 43 y.o.male who presents for Colonoscopy for screening.  The risks and benefits of endoscopic evaluation/treatment were discussed with the patient and/or family; these include but are not limited to the risk of perforation,  infection, bleeding, missed lesions, lack of diagnosis, severe illness requiring hospitalization, as well as anesthesia and sedation related illnesses.  The patient's history has been reviewed, patient examined, no change in status, and deemed stable for procedure.  The patient and/or family is agreeable to proceed.    Aloha Finner, MD [facility] Office # [phone]

## 2023-08-18 NOTE — Patient Instructions (Signed)
 High fiber diet, use FiberCon 1-2 tablets PO daily Continue present medications  Await pathology results  Repeat in likely 3-5 years for surveillance based on pathology results See handouts for polyps and diverticulosis  YOU HAD AN ENDOSCOPIC PROCEDURE TODAY AT THE [facility]:   Refer to the procedure report that was given to you for any specific questions about what was found during the examination.  If the procedure report does not answer your questions, please call your gastroenterologist to clarify.  If you requested that your care partner not be given the details of your procedure findings, then the procedure report has been included in a sealed envelope for you to review at your convenience later.  YOU SHOULD EXPECT: Some feelings of bloating in the abdomen. Passage of more gas than usual.  Walking can help get rid of the air that was put into your GI tract during the procedure and reduce the bloating. If you had a lower endoscopy (such as a colonoscopy or flexible sigmoidoscopy) you may notice spotting of blood in your stool or on the toilet paper. If you underwent a bowel prep for your procedure, you may not have a normal bowel movement for a few days.  Please Note:  You might notice some irritation and congestion in your nose or some drainage.  This is from the oxygen used during your procedure.  There is no need for concern and it should clear up in a day or so.  SYMPTOMS TO REPORT IMMEDIATELY:  Following lower endoscopy (colonoscopy or flexible sigmoidoscopy):  Excessive amounts of blood in the stool  Significant tenderness or worsening of abdominal pains  Swelling of the abdomen that is new, acute  Fever of 100F or higher  For urgent or emergent issues, a gastroenterologist can be reached at any hour by calling [phone]. Do not use MyChart messaging for urgent concerns.   DIET:  We do recommend a small meal at first, but then you may proceed to your regular  diet.  Drink plenty of fluids but you should avoid alcoholic beverages for 24 hours.  ACTIVITY:  You should plan to take it easy for the rest of today and you should NOT DRIVE or use heavy machinery until tomorrow (because of the sedation medicines used during the test).    FOLLOW UP: Our staff will call the number listed on your records the next business day following your procedure.  We will call around 7:15- 8:00 am to check on you and address any questions or concerns that you may have regarding the information given to you following your procedure. If we do not reach you, we will leave a message.     If any biopsies were taken you will be contacted by phone or by letter within the next 1-3 weeks.  Please call us  at [phone] if you have not heard about the biopsies in 3 weeks.   SIGNATURES/CONFIDENTIALITY: You and/or your care partner have signed paperwork which will be entered into your electronic medical record.  These signatures attest to the fact that that the information above on your After Visit Summary has been reviewed and is understood.  Full responsibility of the confidentiality of this discharge information lies with you and/or your care-partner.

## 2023-08-21 ENCOUNTER — Encounter: Payer: Self-pay | Admitting: Family Medicine

## 2023-08-21 ENCOUNTER — Telehealth: Payer: Self-pay

## 2023-08-21 NOTE — Telephone Encounter (Signed)
  Follow up Call-     08/18/2023   10:26 AM 08/18/2023   10:15 AM  Call back number  Post procedure Call Back phone  # [phone]   Permission to leave phone message  Yes     Patient questions:  Do you have a fever, pain , or abdominal swelling? No. Pain Score  0 *  Have you tolerated food without any problems? Yes.    Have you been able to return to your normal activities? Yes.    Do you have any questions about your discharge instructions: Diet   No. Medications  No. Follow up visit  No.  Do you have questions or concerns about your Care? No.  Actions: * If pain score is 4 or above: No action needed, pain <4.

## 2023-08-22 LAB — SURGICAL PATHOLOGY

## 2023-08-24 ENCOUNTER — Ambulatory Visit: Payer: Self-pay | Admitting: Gastroenterology

## 2023-10-06 ENCOUNTER — Encounter: Payer: Self-pay | Admitting: Family Medicine

## 2023-10-06 ENCOUNTER — Ambulatory Visit: Admitting: Family Medicine

## 2023-10-06 VITALS — BP 138/76 | HR 98 | Temp 99.2°F | Ht 71.0 in | Wt 245.4 lb

## 2023-10-06 DIAGNOSIS — R7303 Prediabetes: Secondary | ICD-10-CM

## 2023-10-06 DIAGNOSIS — I10 Essential (primary) hypertension: Secondary | ICD-10-CM | POA: Diagnosis not present

## 2023-10-06 DIAGNOSIS — R000 Tachycardia, unspecified: Secondary | ICD-10-CM

## 2023-10-06 DIAGNOSIS — Z23 Encounter for immunization: Secondary | ICD-10-CM

## 2023-10-06 NOTE — Patient Instructions (Addendum)
 Thyroid  check today blood work. Blood pressures are doing much better! Continue current medicines.  Continue regular exercise routine.   Local [employer] may offer a Diabetes Prevention Program - if interested, you may go to below website to learn more or sign up for the program:  [employer]    VISIT SUMMARY: Today, you came in for a blood pressure check and follow-up. We discussed your current medications, your home blood pressure readings, and your exercise routine. We also noted a possible issue with your thyroid  - will check blood work for this.  YOUR PLAN: -HYPERTENSION: Hypertension means high blood pressure. Your blood pressure is well controlled with your current medications. Continue taking amlodipine  10 mg daily, metoprolol  succinate 25 mg daily, and benazepril  10 mg daily. Make sure to check your blood pressure in the morning before having coffee or doing any activities. Recheck your home blood pressure and compare it with the office readings.   INSTRUCTIONS: Please follow up with the thyroid  function tests today. Continue monitoring your blood pressure at home as advised. Keep up with your exercise routine to manage your prediabetes.

## 2023-10-06 NOTE — Progress Notes (Signed)
 Ph: [phone] Fax: [phone]   Patient ID: Derrick Santiago, male    DOB: 1980/02/15, 43 y.o.   MRN: [id number]  This visit was conducted in person.  BP 138/76   Pulse 98   Temp 99.2 F (37.3 C) (Oral)   Ht 5' 11 (1.803 m)   Wt 245 lb 6 oz (111.3 kg)   SpO2 98%   BMI 34.22 kg/m   BP Readings from Last 3 Encounters:  10/06/23 138/76  08/18/23 121/88  07/05/23 (!) 160/92    On my recheck 138/80 With home cuff today - 142/84  Pulse Readings from Last 3 Encounters:  10/06/23 98  08/18/23 87  07/05/23 100   Chief Complaint  Patient presents with   Medical Management of Chronic Issues    Pt is here for 3 month f/u for HTN    Subjective:   Discussed the use of AI scribe software for clinical note transcription with the patient, who gave verbal consent to proceed.  History of Present Illness   Derrick Santiago is a 43 year old male with hypertension who presents for a blood pressure check and follow-up.  He is on amlodipine  10 mg daily, metoprolol  succinate 25 mg daily, and benazepril  10 mg daily. He experiences a dry cough with benazepril  when not hydrated, which resolves with increased water intake.  He brought his blood pressure cuff to compare readings. He has been taking his blood pressure incorrectly at home, often after coffee. His home reading this morning was 141/85, taken after coffee but before medications. In the office, his blood pressure was 113/71.  He has no symptoms of low or high blood pressure, such as dizziness, headaches, chest pain, shortness of breath, leg or ankle swelling, or vision changes. He takes all medications in the morning around 7:30 AM.  He also denies symptoms of hyper or hypothyroid symptoms including unexpected weight gain or loss, fatigue, hot or cold intolerance, diarrhea/constipation or skin or hair changes.   He recently joined a [employer] to increase aerobic exercise, in addition to regular walking and cycling. His  weight has increased 10 lbs since the last visit.          Relevant past medical, surgical, family and social history reviewed and updated as indicated. Interim medical history since our last visit reviewed. Allergies and medications reviewed and updated. Outpatient Medications Prior to Visit  Medication Sig Dispense Refill   amLODipine  (NORVASC ) 10 MG tablet Take 1 tablet (10 mg total) by mouth daily. 90 tablet 3   aspirin-acetaminophen-caffeine (EXCEDRIN MIGRAINE) 250-250-65 MG per tablet Take 2 tablets by mouth as needed.       atorvastatin  (LIPITOR) 20 MG tablet Take 1 tablet (20 mg total) by mouth every Monday, Wednesday, and Friday for 1 dose. 40 tablet 3   benazepril  (LOTENSIN ) 10 MG tablet Take 1 tablet (10 mg total) by mouth daily. 90 tablet 1   co-enzyme Q-10 30 MG capsule Take 1 capsule (30 mg total) by mouth every Monday, Wednesday, and Friday.     hydrOXYzine  (ATARAX ) 25 MG tablet TAKE 1 TABLET BY MOUTH TWICE A DAY AS NEEDED FOR ANXIETY (SEDATION PRECAUTION) 180 tablet 1   Krill Oil 300 MG CAPS Take 1 capsule by mouth daily.     loratadine (CLARITIN) 10 MG tablet Take 10 mg by mouth daily as needed.     metoprolol  succinate (TOPROL -XL) 25 MG 24 hr tablet Take 1 tablet (25 mg total) by mouth daily. 90 tablet  3   Multiple Vitamin (MULTIVITAMIN) tablet Take 1 tablet by mouth daily.     SUMAtriptan  (IMITREX ) 50 MG tablet TAKE 1 TABLET BY MOUTH DAILY AS NEEDED FOR MIGRAINE. MAY REPEAT IN 2 HRS IF NOT RELIEVED. 10 tablet 2   No facility-administered medications prior to visit.     Per HPI unless specifically indicated in ROS section below Review of Systems  Objective:  BP 138/76   Pulse 98   Temp 99.2 F (37.3 C) (Oral)   Ht 5' 11 (1.803 m)   Wt 245 lb 6 oz (111.3 kg)   SpO2 98%   BMI 34.22 kg/m   Wt Readings from Last 3 Encounters:  10/06/23 245 lb 6 oz (111.3 kg)  08/18/23 235 lb (106.6 kg)  07/31/23 235 lb (106.6 kg)      Physical Exam   VITALS: P- 98, BP-  138/80 HEENT: Moist mucous membranes. NECK: possible thyroid  fullness on the right, ?small nodule. CHEST: Lungs clear to auscultation bilaterally. No wheezing or crackles. CARDIOVASCULAR: Normal S1, S2. No murmurs, rubs, or gallops. EXTREMITIES: No pedal edema. NEUROLOGICAL: Pupils equal, round, reactive to light and accommodation. Extraocular movements intact. CN otherwise grossly intact      Physical Exam    Results   DIAGNOSTIC Colonoscopy: Blood pressure 121/88       Results for orders placed or performed in visit on 08/18/23  Surgical pathology ([facility])   Collection Time: 08/18/23 12:00 AM  Result Value Ref Range   SURGICAL PATHOLOGY      SURGICAL PATHOLOGY [facility] [address] [place], [place] [address] Telephone [phone] or [phone] Fax [phone]  REPORT OF SURGICAL PATHOLOGY   Accession #: [id number] Patient Name: JERARDO, COSTABILE Visit # : [id number]  MRN: [id number] Physician: Wilhelmenia Roers DOB/Age [birth year] (Age: 41) Gender: M Collected Date: 08/18/2023 Received Date: 08/21/2023  FINAL DIAGNOSIS       1. Surgical [P], colon, transverse, polyp (1) :       - TUBULAR ADENOMA.       DATE SIGNED OUT: 08/22/2023 ELECTRONIC SIGNATURE : Coronel Md, Misti, Pathologist, Electronic Signature  MICROSCOPIC DESCRIPTION  CASE COMMENTS STAINS USED IN DIAGNOSIS: H&E    CLINICAL HISTORY  SPECIMEN(S) OBTAINED 1. Surgical [P], Colon, Transverse, Polyp (1)  SPECIMEN COMMENTS: 1. Special screening for malignant neoplasms, colon; benign neoplasm of transverse colon; benign neoplasm of rectosigmoid junction SPECIMEN CLINICAL INFORMATION:  1. R/O adenoma    Gross Description 1. Received in formalin are tan, soft tissue fragments that are submitted in toto. Number: 1 Size: 0.8 cm, (1B) ( TA )        Report signed out from the following location(s) [facility] [address] [place], [place] [address] CLIA #: [id number]  [facility] [address] [place], [place] [address] CLIA #: [id number]    Lab Results  Component Value Date   NA 137 07/19/2023   CL 104 07/19/2023   K 4.1 07/19/2023   CO2 27 07/19/2023   BUN 11 07/19/2023   CREATININE 0.82 07/19/2023   GFR 106.18 07/19/2023   CALCIUM  9.3 07/19/2023   PHOS 1.2 (L) 08/31/2010   ALBUMIN 4.7 06/28/2023   GLUCOSE 162 (H) 07/19/2023    Lab Results  Component Value Date   TSH 1.58 10/25/2019    Assessment & Plan:      Hypertension Blood pressure controlled with current regimen. Dry cough from benazepril  managed with hydration. To  let me know if becoming more bothersome for medication changes (likely ARB).  - Continue amlodipine  10 mg daily, metoprolol  succinate 25 mg daily, and benazepril  10 mg daily. - Advise morning blood pressure checks before coffee and activities.  Right thyroid  fullness Possible thyroid  fullness noted - ?nodule. Elevated heart rate may indicate thyroid  dysfunction. Last TSH 2021 WNL.  - Order thyroid  function tests today (TSH, fT4). - Consider thyroid  ultrasound if thyroid  function tests are abnormal.  Prediabetes Previous A1c in prediabetes range. High blood sugar postprandial. Exercise routine mentioned, including walking, cycling, and gym activities. - Encourage continuation of regular exercise routine, including walking, cycling, and gym activities.  - Provided with DPP program information through [facility] which is his gym.      Problem List Items Addressed This Visit     White coat syndrome with diagnosis of hypertension - Primary   Relevant Orders   TSH   T4, free   Tachycardia   Relevant Orders   TSH   T4, free   Prediabetes   Other Visit Diagnoses       Encounter for immunization       Relevant Orders   Flu vaccine trivalent PF, 6mos and older(Flulaval,Afluria,Fluarix,Fluzone) (Completed)        No orders of the defined types  were placed in this encounter.   Orders Placed This Encounter  Procedures   Flu vaccine trivalent PF, 6mos and older(Flulaval,Afluria,Fluarix,Fluzone)   TSH   T4, free    Patient Instructions  Thyroid  check today blood work. Blood pressures are doing much better! Continue current medicines.  Continue regular exercise routine.   Local [employer] may offer a Diabetes Prevention Program - if interested, you may go to below website to learn more or sign up for the program:  [email or web address]    VISIT SUMMARY: Today, you came in for a blood pressure check and follow-up. We discussed your current medications, your home blood pressure readings, and your exercise routine. We also noted a possible issue with your thyroid  - will check blood work for this.  YOUR PLAN: -HYPERTENSION: Hypertension means high blood pressure. Your blood pressure is well controlled with your current medications. Continue taking amlodipine  10 mg daily, metoprolol  succinate 25 mg daily, and benazepril  10 mg daily. Make sure to check your blood pressure in the morning before having coffee or doing any activities. Recheck your home blood pressure and compare it with the office readings.   INSTRUCTIONS: Please follow up with the thyroid  function tests today. Continue monitoring your blood pressure at home as advised. Keep up with your exercise routine to manage your prediabetes.   Follow up plan: No follow-ups on file.  Anton Blas, MD

## 2023-10-07 ENCOUNTER — Ambulatory Visit: Payer: Self-pay | Admitting: Family Medicine

## 2023-10-07 LAB — T4, FREE: Free T4: 1.2 ng/dL (ref 0.8–1.8)

## 2023-10-07 LAB — TSH: TSH: 1.44 m[IU]/L (ref 0.40–4.50)
